# Patient Record
Sex: Male | Born: 1935 | Race: White | Hispanic: No | Marital: Married | Smoking: Never smoker
Health system: Southern US, Community
[De-identification: ages and names within clinical notes are randomized; demographics above are authoritative.]

## PROBLEM LIST (undated history)

## (undated) DIAGNOSIS — M160 Bilateral primary osteoarthritis of hip: Secondary | ICD-10-CM

## (undated) DIAGNOSIS — G629 Polyneuropathy, unspecified: Secondary | ICD-10-CM

## (undated) DIAGNOSIS — L989 Disorder of the skin and subcutaneous tissue, unspecified: Secondary | ICD-10-CM

## (undated) DIAGNOSIS — G510 Bell's palsy: Secondary | ICD-10-CM

## (undated) DIAGNOSIS — K5900 Constipation, unspecified: Secondary | ICD-10-CM

## (undated) DIAGNOSIS — K219 Gastro-esophageal reflux disease without esophagitis: Secondary | ICD-10-CM

## (undated) DIAGNOSIS — M1990 Unspecified osteoarthritis, unspecified site: Secondary | ICD-10-CM

## (undated) DIAGNOSIS — R7301 Impaired fasting glucose: Secondary | ICD-10-CM

## (undated) DIAGNOSIS — H409 Unspecified glaucoma: Secondary | ICD-10-CM

## (undated) DIAGNOSIS — G4733 Obstructive sleep apnea (adult) (pediatric): Secondary | ICD-10-CM

## (undated) DIAGNOSIS — H532 Diplopia: Secondary | ICD-10-CM

## (undated) DIAGNOSIS — D126 Benign neoplasm of colon, unspecified: Secondary | ICD-10-CM

## (undated) DIAGNOSIS — S060XAA Concussion with loss of consciousness status unknown, initial encounter: Secondary | ICD-10-CM

## (undated) DIAGNOSIS — M545 Low back pain, unspecified: Secondary | ICD-10-CM

## (undated) DIAGNOSIS — S060X9A Concussion with loss of consciousness of unspecified duration, initial encounter: Secondary | ICD-10-CM

## (undated) DIAGNOSIS — G379 Demyelinating disease of central nervous system, unspecified: Secondary | ICD-10-CM

## (undated) HISTORY — PX: ANKLE SURGERY: SHX546

## (undated) HISTORY — PX: OTHER SURGICAL HISTORY: SHX169

## (undated) HISTORY — DX: Low back pain: M54.5

## (undated) HISTORY — DX: Unspecified glaucoma: H40.9

## (undated) HISTORY — DX: Bell's palsy: G51.0

## (undated) HISTORY — DX: Obstructive sleep apnea (adult) (pediatric): G47.33

## (undated) HISTORY — DX: Concussion with loss of consciousness status unknown, initial encounter: S06.0XAA

## (undated) HISTORY — DX: Bilateral primary osteoarthritis of hip: M16.0

## (undated) HISTORY — PX: APPENDECTOMY: SHX54

## (undated) HISTORY — PX: IRRIGATION AND DEBRIDEMENT SEBACEOUS CYST: SHX5255

## (undated) HISTORY — DX: Demyelinating disease of central nervous system, unspecified: G37.9

## (undated) HISTORY — DX: Disorder of the skin and subcutaneous tissue, unspecified: L98.9

## (undated) HISTORY — DX: Low back pain, unspecified: M54.50

## (undated) HISTORY — DX: Diplopia: H53.2

## (undated) HISTORY — DX: Polyneuropathy, unspecified: G62.9

## (undated) HISTORY — DX: Benign neoplasm of colon, unspecified: D12.6

## (undated) HISTORY — PX: CARDIAC CATHETERIZATION: SHX172

## (undated) HISTORY — DX: Constipation, unspecified: K59.00

## (undated) HISTORY — DX: Concussion with loss of consciousness of unspecified duration, initial encounter: S06.0X9A

## (undated) HISTORY — DX: Impaired fasting glucose: R73.01

---

## 2000-02-08 ENCOUNTER — Ambulatory Visit (HOSPITAL_COMMUNITY): Admission: RE | Admit: 2000-02-08 | Discharge: 2000-02-08 | Payer: Self-pay | Admitting: Gastroenterology

## 2000-02-11 ENCOUNTER — Ambulatory Visit (HOSPITAL_BASED_OUTPATIENT_CLINIC_OR_DEPARTMENT_OTHER): Admission: RE | Admit: 2000-02-11 | Discharge: 2000-02-11 | Payer: Self-pay | Admitting: General Surgery

## 2002-09-12 ENCOUNTER — Encounter: Payer: Self-pay | Admitting: Gastroenterology

## 2002-09-12 ENCOUNTER — Encounter: Admission: RE | Admit: 2002-09-12 | Discharge: 2002-09-12 | Payer: Self-pay | Admitting: Gastroenterology

## 2005-11-25 ENCOUNTER — Encounter: Admission: RE | Admit: 2005-11-25 | Discharge: 2005-11-25 | Payer: Self-pay | Admitting: Internal Medicine

## 2011-07-19 ENCOUNTER — Other Ambulatory Visit: Payer: Self-pay | Admitting: Dermatology

## 2012-02-10 ENCOUNTER — Encounter (HOSPITAL_COMMUNITY): Payer: Self-pay | Admitting: Pharmacy Technician

## 2012-02-10 ENCOUNTER — Encounter (HOSPITAL_COMMUNITY): Payer: Self-pay | Admitting: *Deleted

## 2012-02-14 ENCOUNTER — Ambulatory Visit (HOSPITAL_COMMUNITY): Payer: Medicare Other | Admitting: Anesthesiology

## 2012-02-14 ENCOUNTER — Encounter (HOSPITAL_COMMUNITY)
Admission: RE | Disposition: A | Discharge status: Home / Self Care | Payer: Self-pay | Source: Ambulatory Visit | Attending: Gastroenterology

## 2012-02-14 ENCOUNTER — Ambulatory Visit (HOSPITAL_COMMUNITY)
Admission: RE | Admit: 2012-02-14 | Discharge: 2012-02-14 | Disposition: A | Discharge status: Home / Self Care | Payer: Medicare Other | Source: Ambulatory Visit | Attending: Gastroenterology | Admitting: Gastroenterology

## 2012-02-14 ENCOUNTER — Encounter (HOSPITAL_COMMUNITY): Payer: Self-pay | Admitting: Anesthesiology

## 2012-02-14 ENCOUNTER — Encounter (HOSPITAL_COMMUNITY): Payer: Self-pay | Admitting: *Deleted

## 2012-02-14 DIAGNOSIS — Z860100 Personal history of colon polyps, unspecified: Secondary | ICD-10-CM | POA: Insufficient documentation

## 2012-02-14 DIAGNOSIS — Z79899 Other long term (current) drug therapy: Secondary | ICD-10-CM | POA: Insufficient documentation

## 2012-02-14 DIAGNOSIS — Z7982 Long term (current) use of aspirin: Secondary | ICD-10-CM | POA: Insufficient documentation

## 2012-02-14 DIAGNOSIS — K5900 Constipation, unspecified: Secondary | ICD-10-CM | POA: Insufficient documentation

## 2012-02-14 DIAGNOSIS — H409 Unspecified glaucoma: Secondary | ICD-10-CM | POA: Insufficient documentation

## 2012-02-14 DIAGNOSIS — K5730 Diverticulosis of large intestine without perforation or abscess without bleeding: Secondary | ICD-10-CM | POA: Insufficient documentation

## 2012-02-14 DIAGNOSIS — Z09 Encounter for follow-up examination after completed treatment for conditions other than malignant neoplasm: Secondary | ICD-10-CM | POA: Insufficient documentation

## 2012-02-14 DIAGNOSIS — K219 Gastro-esophageal reflux disease without esophagitis: Secondary | ICD-10-CM | POA: Insufficient documentation

## 2012-02-14 HISTORY — DX: Gastro-esophageal reflux disease without esophagitis: K21.9

## 2012-02-14 HISTORY — PX: COLONOSCOPY WITH PROPOFOL: SHX5780

## 2012-02-14 HISTORY — DX: Unspecified osteoarthritis, unspecified site: M19.90

## 2012-02-14 SURGERY — COLONOSCOPY WITH PROPOFOL
Anesthesia: Monitor Anesthesia Care

## 2012-02-14 MED ORDER — PROPOFOL 10 MG/ML IV EMUL
INTRAVENOUS | Status: DC | PRN
  Administered 2012-02-14: 120 ug/kg/min via INTRAVENOUS

## 2012-02-14 MED ORDER — MIDAZOLAM HCL 5 MG/5ML IJ SOLN
INTRAMUSCULAR | Status: DC | PRN
  Administered 2012-02-14: 2 mg via INTRAVENOUS

## 2012-02-14 MED ORDER — LACTATED RINGERS IV SOLN
INTRAVENOUS | Status: DC | PRN
  Administered 2012-02-14: 13:00:00 via INTRAVENOUS

## 2012-02-14 MED ORDER — KETAMINE HCL 10 MG/ML IJ SOLN
INTRAMUSCULAR | Status: DC | PRN
  Administered 2012-02-14 (×2): 10 mg via INTRAVENOUS

## 2012-02-14 MED ORDER — FENTANYL CITRATE 0.05 MG/ML IJ SOLN
INTRAMUSCULAR | Status: DC | PRN
  Administered 2012-02-14: 50 ug via INTRAVENOUS

## 2012-02-14 SURGICAL SUPPLY — 22 items

## 2012-02-14 NOTE — Preoperative (Signed)
Beta Blockers   Reason not to administer Beta Blockers:Not Applicable 

## 2012-02-14 NOTE — Anesthesia Preprocedure Evaluation (Addendum)
Anesthesia Evaluation  Patient identified by MRN, date of birth, ID band Patient awake    Reviewed: Allergy & Precautions, H&P , NPO status , Patient's Chart, lab work & pertinent test results  Airway Mallampati: II TM Distance: >3 FB Neck ROM: Full    Dental No notable dental hx.    Pulmonary neg pulmonary ROS,  breath sounds clear to auscultation  Pulmonary exam normal       Cardiovascular negative cardio ROS  Rhythm:Regular Rate:Normal     Neuro/Psych negative neurological ROS  negative psych ROS   GI/Hepatic negative GI ROS, Neg liver ROS, GERD-  Medicated and Controlled,  Endo/Other  negative endocrine ROS  Renal/GU negative Renal ROS  negative genitourinary   Musculoskeletal negative musculoskeletal ROS (+)   Abdominal   Peds negative pediatric ROS (+)  Hematology negative hematology ROS (+)   Anesthesia Other Findings   Reproductive/Obstetrics negative OB ROS                           Anesthesia Physical Anesthesia Plan  ASA: II  Anesthesia Plan: MAC   Post-op Pain Management:    Induction:   Airway Management Planned: Simple Face Mask  Additional Equipment:   Intra-op Plan:   Post-operative Plan:   Informed Consent: I have reviewed the patients History and Physical, chart, labs and discussed the procedure including the risks, benefits and alternatives for the proposed anesthesia with the patient or authorized representative who has indicated his/her understanding and acceptance.   Dental advisory given  Plan Discussed with: CRNA  Anesthesia Plan Comments:         Anesthesia Quick Evaluation

## 2012-02-14 NOTE — Op Note (Signed)
Procedure: Surveillance colonoscopy  Endoscopist: Danise Edge  Premedication: Propofol administered by anesthesia  Procedure: The patient was placed in the left lateral decubitus position. Anal inspection and digital rectal exam were normal. The Pentax pediatric colonoscope was introduced into the rectum and advanced to the cecum. A normal-appearing ileocecal valve and appendiceal orifice were identified. Colonic preparation for the exam today was good.  Rectum. Normal. Retroflexed view of the distal rectum normal.  Sigmoid colon and descending colon. Normal. Left colonic diverticulosis was present.  Splenic flexure. Normal.  Transverse colon. Normal.  Hepatic flexure. Normal.  Ascending colon. Normal.  Cecum and ileocecal valve. Normal.  Assessment: Normal surveillance proctocolonoscopy to the cecum. Left colonic diverticulosis present.  Recommendations: The patient requires no further surveillance colonoscopies.

## 2012-02-14 NOTE — H&P (Signed)
Procedure: Surveillance colonoscopy  History: The patient is a 78 year old male born 1933/03/11 who is scheduled to undergo a surveillance colonoscopy with polypectomy to prevent colon cancer. He has undergone surveillance colonoscopies in the past to remove adenomatous colon polyps.  I discussed with the patient the complications associated with colonoscopy including intestinal bleeding, intestinal perforation, and oversedation.  Chronic medication: Aspirin. Aleve. Travatan eyedrops. Omeprazole. Omega-3 fatty acids. Simvastatin.  Past medical and surgical history: Inguinal herniorrhaphy. Appendectomy. Ankle surgery. Sebaceous skin cyst removal. Glaucoma. Chronic constipation. Gastroesophageal reflux. Chronic low back pain. Impaired fasting glucose. Raynaud's phenomenon. History of adenomatous colon polyps. Ankle fracture.  Exam: The patient is alert and lying comfortably on the endoscopy stretcher. Sclera are nonicteric. Mouth and throat appear normal. Lungs are clear to auscultation. Cardiac exam reveals a regular rhythm. Abdomen is soft, flat, and nontender to palpation in all quadrants.  Plan: Proceed with surveillance colonoscopy as scheduled.

## 2012-02-14 NOTE — Anesthesia Postprocedure Evaluation (Signed)
  Anesthesia Post-op Note  Patient: Mathew Ochoa  Procedure(s) Performed: Procedure(s) (LRB): COLONOSCOPY WITH PROPOFOL (N/A)  Patient Location: PACU  Anesthesia Type: MAC  Level of Consciousness: awake and alert   Airway and Oxygen Therapy: Patient Spontanous Breathing  Post-op Pain: mild  Post-op Assessment: Post-op Vital signs reviewed, Patient's Cardiovascular Status Stable, Respiratory Function Stable, Patent Airway and No signs of Nausea or vomiting  Post-op Vital Signs: stable  Complications: No apparent anesthesia complications

## 2012-02-14 NOTE — Transfer of Care (Signed)
Immediate Anesthesia Transfer of Care Note  Patient: Roda Shutters  Procedure(s) Performed: Procedure(s) (LRB) with comments: COLONOSCOPY WITH PROPOFOL (N/A) - katrina/leone  Patient Location: PACU  Anesthesia Type:MAC  Level of Consciousness: awake, alert , oriented and patient cooperative  Airway & Oxygen Therapy: Patient Spontanous Breathing and Patient connected to face mask oxygen  Post-op Assessment: Report given to PACU RN, Post -op Vital signs reviewed and stable and Patient moving all extremities X 4  Post vital signs: Reviewed and stable  Complications: No apparent anesthesia complications

## 2012-02-15 ENCOUNTER — Encounter (HOSPITAL_COMMUNITY): Payer: Self-pay | Admitting: Gastroenterology

## 2012-06-12 ENCOUNTER — Ambulatory Visit (INDEPENDENT_AMBULATORY_CARE_PROVIDER_SITE_OTHER): Payer: Medicare Other

## 2012-06-12 DIAGNOSIS — R209 Unspecified disturbances of skin sensation: Secondary | ICD-10-CM

## 2012-06-13 NOTE — Progress Notes (Signed)
  Mathew Ochoa is a 79 year old gentleman with a history of diabetes. The patient gives a history in the distant past with exposure to agent orange. The patient is being evaluated for a possible peripheral neuropathy.  Nerve conduction studies were performed on all 4 extremities. The distal motor latencies for the median nerves were prolonged bilaterally, with normal motor amplitudes seen for these nerves. The distal motor latencies for the ulnar nerves were prolonged on the left, normal on the right, with normal motor amplitudes for these nerves. The F wave latencies for the median nerves were prolonged on the right, normal on the left, and normal for the ulnar nerves bilaterally. The nerve conduction velocities for the median and ulnar nerves were normal bilaterally. The sensory latencies for the median nerves were prolonged bilaterally, normal for the ulnar nerves bilaterally.  The distal motor latencies for the peroneal nerves were prolonged on the left, normal on the right, with normal motor amplitudes for these nerves bilaterally. The nerve conduction velocities for the peroneal nerves were normal bilaterally. The distal motor latencies for the posterior tibial nerves were prolonged bilaterally, with low normal motor amplitudes for these nerves bilaterally. The nerve conduction velocities for the posterior tibial nerves were normal. The sensory latencies for the peroneal nerves were prolonged on the right, and normal on the left.  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 No other significant abnormalities were seen.

## 2012-06-15 NOTE — Addendum Note (Signed)
Addended by: Gearldine Shown on: 06/15/2012 02:51 PM   Modules accepted: Orders

## 2012-07-12 ENCOUNTER — Institutional Professional Consult (permissible substitution): Payer: Medicare Other | Admitting: Diagnostic Neuroimaging

## 2012-07-19 ENCOUNTER — Ambulatory Visit (INDEPENDENT_AMBULATORY_CARE_PROVIDER_SITE_OTHER): Payer: Medicare Other | Admitting: Diagnostic Neuroimaging

## 2012-07-19 ENCOUNTER — Encounter: Payer: Self-pay | Admitting: Diagnostic Neuroimaging

## 2012-07-19 VITALS — BP 127/72 | HR 59 | Temp 98.0°F | Ht 68.5 in | Wt 204.5 lb

## 2012-07-19 DIAGNOSIS — G589 Mononeuropathy, unspecified: Secondary | ICD-10-CM

## 2012-07-19 DIAGNOSIS — G629 Polyneuropathy, unspecified: Secondary | ICD-10-CM | POA: Insufficient documentation

## 2012-07-19 NOTE — Progress Notes (Signed)
[facility]  PATIENT: Mathew Ochoa DOB: February 23, 1933  REFERRING CLINICIAN: Osbourne HISTORY FROM: patient and wife REASON FOR VISIT: new consult   HISTORICAL  CHIEF COMPLAINT:  Chief Complaint  Patient presents with  . Follow-up    NCV    HISTORY OF PRESENT ILLNESS:   79 year old male here for evaluation of neuropathy. Patient reports a least ten-year history of intermittent numbness and tingling in his fingers and feet. He also notices color change of his fingertips and toes in cold weather (changes to blue-collar). Fingers and toes do not turn white. Patient did not seek medical attention for this problem until recently. Patient had nerve conduction study which showed evidence for bilateral carpal tunnel syndrome as well as underlying mixed axonal and demyelinating peripheral neuropathy. Patient's been evaluated by rheumatology for Raynaud's syndrome. Extensive serum testing has been unremarkable so far.  Patient reports that his symptoms are significantly painful when he is in cold situations. Temperature is warm his symptoms are not as severe. During our conversation patient reports mild discomfort. However he is not interested in taking additional medication for pain control. He is also not that interested in pursuing additional diagnostic testing such as lumbar puncture for evaluation of possible CIDP, nor treatment with IVIG at this time. He is pursuing disability services through the [employer]. Patient reports possible exposure to agent orange during his service.  Patient's wife is accompanying him for this visit. During our conversation she repeatedly asked me whether patient's peripheral neuropathy, Raynaud's phenomenon symptoms, or anything else could explain why the patient is having significant memory problems. Patient acknowledges some memory problems. However referring notes do not mention this problem. I spent the majority of this visit  evaluating the patient's neuropathy, and therefore did not have opportunity to fully evaluate his memory issue.    REVIEW OF SYSTEMS: Full 14 system review of systems performed and notable only for memory loss.  ALLERGIES: No Known Allergies  HOME MEDICATIONS: Outpatient Prescriptions Prior to Visit  Medication Sig Dispense Refill  . aspirin EC 81 MG tablet Take 81 mg by mouth every morning.      Marland Kitchen ibuprofen (ADVIL,MOTRIN) 200 MG tablet Take 200 mg by mouth every 6 (six) hours as needed. Pain      . naproxen sodium (ANAPROX) 220 MG tablet Take 220 mg by mouth 2 (two) times daily with a meal.      . omeprazole (PRILOSEC) 20 MG capsule Take 20 mg by mouth daily.      . simvastatin (ZOCOR) 20 MG tablet Take 20 mg by mouth every evening.      . timolol (TIMOPTIC) 0.5 % ophthalmic solution Place 1 drop into both eyes 2 (two) times daily.      . Travoprost, BAK Free, (TRAVATAN) 0.004 % SOLN ophthalmic solution Place 1 drop into both eyes at bedtime.      . fish oil-omega-3 fatty acids 1000 MG capsule Take 2 g by mouth daily.       No facility-administered medications prior to visit.    PAST MEDICAL HISTORY: Past Medical History  Diagnosis Date  . GERD (gastroesophageal reflux disease)   . Arthritis     PAST SURGICAL HISTORY: Past Surgical History  Procedure Laterality Date  . Right leg surgery for fracture  yrs ago    pin later removed  . Appendectomy      79 yrs old  . Colonoscopy with propofol  02/14/2012    Procedure: COLONOSCOPY WITH PROPOFOL;  Surgeon: Charolett Bumpers, MD;  Location: [facility];  Service: Endoscopy;  Laterality: N/A;  katrina/leone    FAMILY HISTORY: Family History  Problem Relation Age of Onset  . Heart failure Mother   . Heart Problems Father   . Diabetes Father     SOCIAL HISTORY:  History   Social History  . Marital Status: Married    Spouse Name: Darl Pikes    Number of Children: 3  . Years of Education: college   Occupational History  .  Driver     Part Time @ [employer]   Social History Main Topics  . Smoking status: Never Smoker   . Smokeless tobacco: Never Used  . Alcohol Use: No  . Drug Use: No  . Sexually Active:    Other Topics Concern  . Not on file   Social History Narrative   Pt lives at home with his spouse.   Caffeine Use- Rarely drinks 1 soda.     PHYSICAL EXAM  Filed Vitals:   07/19/12 1542  BP: 127/72  Pulse: 59  Temp: 98 F (36.7 C)  TempSrc: Oral  Height: 5' 8.5" (1.74 m)  Weight: 204 lb 8 oz (92.761 kg)   Body mass index is 30.64 kg/(m^2).  GENERAL EXAM: Patient is in no distress  CARDIOVASCULAR: Regular rate and rhythm, no murmurs, no carotid bruits  NEUROLOGIC: MENTAL STATUS: awake, alert, language fluent, comprehension intact, naming intact CRANIAL NERVE: no papilledema on fundoscopic exam, pupils equal and reactive to light, visual fields full to confrontation, extraocular muscles intact, no nystagmus, facial sensation and strength symmetric, uvula midline, shoulder shrug symmetric, tongue midline. MOTOR: normal bulk and tone, full strength in the BUE, BLE SENSORY: DECREASED PP, LT IN TOES IN GRADIENT UP TO SHINS. NORMAL PROPRIO. VIB < 6 SEC AT TOES. COORDINATION: finger-nose-finger, fine finger movements normal REFLEXES: BUE 1, KNEES TRACE, ANKLES 0. GAIT/STATION: narrow based gait; DIFF WITH TOE, HEEL AND TANDEM. Romberg is negative   DIAGNOSTIC DATA (LABS, IMAGING, TESTING) - I reviewed patient records, labs, notes, testing and imaging myself where available.  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  ASSESSMENT AND PLAN  79 y.o. year old male  has a past medical history of GERD (gastroesophageal reflux disease) and Arthritis. here with numbness and tingling in his hands and feet. History, exam, nerve conduction study consistent with underlying distal symmetric peripheral neuropathy and bilateral carpal tunnel syndrome. No clear etiology. Symptoms are intermittently painful. I discussed empiric trial of gabapentin, Lyrica or Cymbalta or neuropathy cream, which could minimize side effects for pain control. Patient is reluctant to try this at this time.  Also, nerve conduction study raises possibility of underlying demyelinating neuropathy, which could further  be evaluated with lumbar puncture and additional blood testing. In turn this could lead to potential treatment with IVIG or other immune modulatory agents.patient is not that interested in pursuing this at this time. He is more interested in pursuing [facility] disability [facility] time.   Of note patient's wife is more interested in pursuing further testing and treatment of memory loss. Unfortunately I did not have the opportunity to fully evaluate this during limited consultation time, which was requested for neuropathy and therefore what I focused on. Patient reports a followup visit with his PCP and the [facility] physician in the coming 1-2 months. I recommended that he and his wife bring up these issues with them for further evaluation.   Suanne Marker, MD 07/19/2012, 4:48 PM Certified in  Neurology, Neurophysiology and Neuroimaging  [facility] [address], [address] [place], [place] [address] [phone]

## 2012-07-19 NOTE — Patient Instructions (Signed)
Use body warming techniques.   Consider gabapentin.   If symptoms are severe enough, we can consider further testing for other causes of neuropathy (such as lumbar puncture to test for CIDP).

## 2013-07-22 ENCOUNTER — Telehealth: Payer: Self-pay | Admitting: Diagnostic Neuroimaging

## 2013-07-22 ENCOUNTER — Encounter (INDEPENDENT_AMBULATORY_CARE_PROVIDER_SITE_OTHER): Payer: Self-pay

## 2013-07-22 ENCOUNTER — Encounter: Payer: Self-pay | Admitting: Diagnostic Neuroimaging

## 2013-07-22 ENCOUNTER — Ambulatory Visit (INDEPENDENT_AMBULATORY_CARE_PROVIDER_SITE_OTHER): Payer: Medicare Other | Admitting: Diagnostic Neuroimaging

## 2013-07-22 VITALS — BP 152/80 | HR 60 | Ht 68.0 in | Wt 212.0 lb

## 2013-07-22 DIAGNOSIS — F329 Major depressive disorder, single episode, unspecified: Secondary | ICD-10-CM

## 2013-07-22 DIAGNOSIS — G4733 Obstructive sleep apnea (adult) (pediatric): Secondary | ICD-10-CM

## 2013-07-22 DIAGNOSIS — Z630 Problems in relationship with spouse or partner: Secondary | ICD-10-CM

## 2013-07-22 DIAGNOSIS — F3289 Other specified depressive episodes: Secondary | ICD-10-CM

## 2013-07-22 DIAGNOSIS — F32A Depression, unspecified: Secondary | ICD-10-CM

## 2013-07-22 DIAGNOSIS — R413 Other amnesia: Secondary | ICD-10-CM

## 2013-07-22 NOTE — Progress Notes (Signed)
[facility]  PATIENT: Mathew Ochoa DOB: 1932/06/11  REFERRING CLINICIAN: Osbourne HISTORY FROM: patient and wife REASON FOR VISIT: new consult   HISTORICAL  CHIEF COMPLAINT:  Chief Complaint  Patient presents with  . Memory Loss    NCXP #6    HISTORY OF PRESENT ILLNESS:   80 year old male here for evaluation of memory problems.  Patient is in exam room alone, and I examined evaluation first and then brought his wife in for interview. Patient himself has noted some problems with forgetting peoples names, occasionally getting lost with driving directions. He says that he can pay bills, drive, take care of yard work and activities of daily living without difficulty. He still works 1 day week. Patient acknowledges poor sleep for many years. He sleeps at 8 PM and wakes up at 4 AM. However he tosses and turns all night. He and his wife slept in different rooms for many years due to his severe snoring problem. Patient sleep study in the past, diagnosed with mild sleep apnea and recommended to have CPAP she. However patient could not tolerate CPAP mask and therefore never tried using it.  Patient having more energy problems, agitation, confusion short temper. He and his wife have had some marital problems for many years, including seeing marriage counselor 20 years ago. When patient's wife came to the room she also mentioned that he is more "mean-spirited" with her than previously. She has noted that he is forgetting peoples names including family members, coworkers. Apparently patient's daughters and patient's coworkers have noticed some personality, behavior and memory problems. When they were in [facility] recently he was getting lost with driving directions.  REVIEW OF SYSTEMS: Full 14 system review of systems performed and notable only for agitation behavior problem confusion neck pain neck stiffness frequent waking cold intolerance memory loss runny nose  fatigue.  ALLERGIES: No Known Allergies  HOME MEDICATIONS: Outpatient Prescriptions Prior to Visit  Medication Sig Dispense Refill  . aspirin EC 81 MG tablet Take 81 mg by mouth every morning.      Marland Kitchen ibuprofen (ADVIL,MOTRIN) 200 MG tablet Take 200 mg by mouth every 6 (six) hours as needed. Pain      . lisinopril-hydrochlorothiazide (PRINZIDE,ZESTORETIC) 10-12.5 MG per tablet Take 1 tablet by mouth daily.      . naproxen sodium (ANAPROX) 220 MG tablet Take 220 mg by mouth 2 (two) times daily with a meal.      . omeprazole (PRILOSEC) 20 MG capsule Take 20 mg by mouth daily.      . timolol (TIMOPTIC) 0.5 % ophthalmic solution Place 1 drop into both eyes 2 (two) times daily.      . Travoprost, BAK Free, (TRAVATAN) 0.004 % SOLN ophthalmic solution Place 1 drop into both eyes at bedtime.      . fish oil-omega-3 fatty acids 1000 MG capsule Take 2 g by mouth daily.      . simvastatin (ZOCOR) 20 MG tablet Take 20 mg by mouth every evening.       No facility-administered medications prior to visit.    PAST MEDICAL HISTORY: Past Medical History  Diagnosis Date  . GERD (gastroesophageal reflux disease)   . Arthritis     PAST SURGICAL HISTORY: Past Surgical History  Procedure Laterality Date  . Right leg surgery for fracture  yrs ago    pin later removed  . Appendectomy      80 yrs old  . Colonoscopy with propofol  02/14/2012  Procedure: COLONOSCOPY WITH PROPOFOL;  Surgeon: Garlan Fair, MD;  Location: [facility];  Service: Endoscopy;  Laterality: N/A;  katrina/leone    FAMILY HISTORY: Family History  Problem Relation Age of Onset  . Heart failure Mother   . Heart Problems Father   . Diabetes Father     SOCIAL HISTORY:  History   Social History  . Marital Status: Married    Spouse Name: Manuela Schwartz    Number of Children: 3  . Years of Education: college   Occupational History  . Driver     Part Time @ [employer]   Social History Main Topics  . Smoking  status: Never Smoker   . Smokeless tobacco: Never Used  . Alcohol Use: No  . Drug Use: No  . Sexual Activity: Not on file   Other Topics Concern  . Not on file   Social History Narrative   Pt lives at home with his spouse.   Caffeine Use- Rarely drinks 1 soda.     PHYSICAL EXAM  Filed Vitals:   07/22/13 1029  BP: 152/80  Pulse: 60  Height: 5\' 8"  (1.727 m)  Weight: 212 lb (96.163 kg)    Not recorded    Body mass index is 32.24 kg/(m^2).  GENERAL EXAM: Patient is in no distress; well developed, nourished and groomed; neck is supple  CARDIOVASCULAR: Regular rate and rhythm, no murmurs, no carotid bruits  NEUROLOGIC: MENTAL STATUS: awake, alert, oriented to person, place and time, recent and remote memory intact, normal attention and concentration, language fluent, comprehension intact, naming intact, fund of knowledge appropriate; MMSE 25/30. MISSES 1 ON REGISTRATION, 2 ON RECALL, 1 ON REPETITION AND 1 ON PENTAGONS. CLOCK DRAWING NORMAL. AFT 13. GDS 5. BORDERLINE SNOUT AND MYERSONS SIGNS. NEG PALMOMENTAL.  CRANIAL NERVE: no papilledema on fundoscopic exam, pupils equal and reactive to light, visual fields full to confrontation, extraocular muscles intact, no nystagmus, facial sensation and strength symmetric, hearing intact, palate elevates symmetrically, uvula midline, shoulder shrug symmetric, tongue midline. MOTOR: normal bulk and tone, full strength in the BUE, BLE SENSORY: normal and symmetric to light touch, pinprick, temperature, vibration COORDINATION: finger-nose-finger, fine finger movements, heel-shin normal REFLEXES: deep tendon reflexes present and symmetric GAIT/STATION: narrow based gait; able to walk on toes, heels and tandem; romberg is negative    DIAGNOSTIC DATA (LABS, IMAGING, TESTING) - I reviewed patient records, labs, notes, testing and imaging myself where available.  No results found for this basename: WBC,  HGB,  HCT,  MCV,  PLT   No results  found for this basename: na,  k,  cl,  co2,  glucose,  bun,  creatinine,  calcium,  prot,  albumin,  ast,  alt,  alkphos,  bilitot,  gfrnonaa,  gfraa   No results found for this basename: CHOL,  HDL,  LDLCALC,  LDLDIRECT,  TRIG,  CHOLHDL   No results found for this basename: HGBA1C   No results found for this basename: VITAMINB12   No results found for this basename: TSH      ASSESSMENT AND PLAN  80 y.o. year old male here with memory loss, navigation problems, behavior/personality changes since past 3-4 years. Financial and marital stress are contributing factors. Also with untreated sleep apnea.   Ddx: mild cognitive impairment, mild dementia, pseudodementia of depression, sleep apnea, marital discord  PLAN: - neuropsych testing - MRI brain  - encouraged sleep apnea eval and treatment; he will consider and then discuss with Dr. Isidoro Donning - marriage  counselling - follow up in 3-4 months; may consider donepezil at that time   Orders Placed This Encounter  Procedures  . MR Brain Wo Contrast  . Ambulatory referral to Neuropsychology   Return in about 4 months (around 11/21/2013).    Penni Bombard, MD 9/93/7169, 67:89 AM Certified in Neurology, Neurophysiology and Neuroimaging  [facility] [address] [place], [place] [address] [phone]

## 2013-08-05 ENCOUNTER — Ambulatory Visit
Admission: RE | Admit: 2013-08-05 | Discharge: 2013-08-05 | Disposition: A | Discharge status: Home / Self Care | Payer: Medicare Other | Source: Ambulatory Visit | Attending: Diagnostic Neuroimaging | Admitting: Diagnostic Neuroimaging

## 2013-08-05 DIAGNOSIS — F32A Depression, unspecified: Secondary | ICD-10-CM

## 2013-08-05 DIAGNOSIS — R413 Other amnesia: Secondary | ICD-10-CM

## 2013-08-05 DIAGNOSIS — F329 Major depressive disorder, single episode, unspecified: Secondary | ICD-10-CM

## 2013-09-05 ENCOUNTER — Telehealth: Payer: Self-pay | Admitting: *Deleted

## 2013-09-05 NOTE — Telephone Encounter (Signed)
I called and spoke to pt and relayed the MRI brain results. He did not want to do the neuropsych eval. Has f/u appt 10-21-13 at 0830.

## 2013-10-21 ENCOUNTER — Ambulatory Visit: Payer: Medicare Other | Admitting: Diagnostic Neuroimaging

## 2015-04-09 DIAGNOSIS — H401131 Primary open-angle glaucoma, bilateral, mild stage: Secondary | ICD-10-CM | POA: Diagnosis not present

## 2015-09-24 DIAGNOSIS — H401131 Primary open-angle glaucoma, bilateral, mild stage: Secondary | ICD-10-CM | POA: Diagnosis not present

## 2015-10-06 DIAGNOSIS — G608 Other hereditary and idiopathic neuropathies: Secondary | ICD-10-CM | POA: Diagnosis not present

## 2015-10-06 DIAGNOSIS — R0789 Other chest pain: Secondary | ICD-10-CM | POA: Diagnosis not present

## 2016-04-06 DIAGNOSIS — H401132 Primary open-angle glaucoma, bilateral, moderate stage: Secondary | ICD-10-CM | POA: Diagnosis not present

## 2016-04-12 DIAGNOSIS — Z1389 Encounter for screening for other disorder: Secondary | ICD-10-CM | POA: Diagnosis not present

## 2016-04-12 DIAGNOSIS — G3184 Mild cognitive impairment, so stated: Secondary | ICD-10-CM | POA: Diagnosis not present

## 2016-04-12 DIAGNOSIS — Z7189 Other specified counseling: Secondary | ICD-10-CM | POA: Diagnosis not present

## 2016-04-12 DIAGNOSIS — G608 Other hereditary and idiopathic neuropathies: Secondary | ICD-10-CM | POA: Diagnosis not present

## 2016-04-12 DIAGNOSIS — K219 Gastro-esophageal reflux disease without esophagitis: Secondary | ICD-10-CM | POA: Diagnosis not present

## 2016-04-12 DIAGNOSIS — Z0000 Encounter for general adult medical examination without abnormal findings: Secondary | ICD-10-CM | POA: Diagnosis not present

## 2016-04-12 DIAGNOSIS — N183 Chronic kidney disease, stage 3 (moderate): Secondary | ICD-10-CM | POA: Diagnosis not present

## 2016-04-12 DIAGNOSIS — E7800 Pure hypercholesterolemia, unspecified: Secondary | ICD-10-CM | POA: Diagnosis not present

## 2016-04-20 DIAGNOSIS — H401132 Primary open-angle glaucoma, bilateral, moderate stage: Secondary | ICD-10-CM | POA: Diagnosis not present

## 2016-06-02 DIAGNOSIS — H401132 Primary open-angle glaucoma, bilateral, moderate stage: Secondary | ICD-10-CM | POA: Diagnosis not present

## 2016-06-30 DIAGNOSIS — H401132 Primary open-angle glaucoma, bilateral, moderate stage: Secondary | ICD-10-CM | POA: Diagnosis not present

## 2016-09-01 DIAGNOSIS — H401132 Primary open-angle glaucoma, bilateral, moderate stage: Secondary | ICD-10-CM | POA: Diagnosis not present

## 2016-09-15 DIAGNOSIS — H401132 Primary open-angle glaucoma, bilateral, moderate stage: Secondary | ICD-10-CM | POA: Diagnosis not present

## 2016-12-29 DIAGNOSIS — H401132 Primary open-angle glaucoma, bilateral, moderate stage: Secondary | ICD-10-CM | POA: Diagnosis not present

## 2017-01-26 DIAGNOSIS — H401132 Primary open-angle glaucoma, bilateral, moderate stage: Secondary | ICD-10-CM | POA: Diagnosis not present

## 2017-01-26 DIAGNOSIS — H524 Presbyopia: Secondary | ICD-10-CM | POA: Diagnosis not present

## 2017-02-10 DIAGNOSIS — H401132 Primary open-angle glaucoma, bilateral, moderate stage: Secondary | ICD-10-CM | POA: Diagnosis not present

## 2017-02-13 DIAGNOSIS — H401132 Primary open-angle glaucoma, bilateral, moderate stage: Secondary | ICD-10-CM | POA: Diagnosis not present

## 2017-02-15 DIAGNOSIS — H401132 Primary open-angle glaucoma, bilateral, moderate stage: Secondary | ICD-10-CM | POA: Insufficient documentation

## 2017-02-23 ENCOUNTER — Encounter (HOSPITAL_COMMUNITY): Payer: Self-pay

## 2017-02-23 ENCOUNTER — Emergency Department (HOSPITAL_COMMUNITY)
Admission: EM | Admit: 2017-02-23 | Discharge: 2017-02-23 | Disposition: A | Discharge status: Home / Self Care | Payer: Medicare HMO | Attending: Emergency Medicine | Admitting: Emergency Medicine

## 2017-02-23 ENCOUNTER — Emergency Department (HOSPITAL_COMMUNITY): Payer: Medicare HMO

## 2017-02-23 ENCOUNTER — Other Ambulatory Visit: Payer: Self-pay

## 2017-02-23 DIAGNOSIS — Z7982 Long term (current) use of aspirin: Secondary | ICD-10-CM | POA: Insufficient documentation

## 2017-02-23 DIAGNOSIS — K5900 Constipation, unspecified: Secondary | ICD-10-CM

## 2017-02-23 DIAGNOSIS — Z79899 Other long term (current) drug therapy: Secondary | ICD-10-CM | POA: Diagnosis not present

## 2017-02-23 MED ORDER — FLEET ENEMA 7-19 GM/118ML RE ENEM
1.0000 | ENEMA | Freq: Once | RECTAL | Status: AC
  Administered 2017-02-23: 1 via RECTAL
  Filled 2017-02-23: qty 1

## 2017-02-23 NOTE — ED Triage Notes (Signed)
BIB EMS from home for constipation. LBM 11/26. Has tried colace with no relief. Called PCP and was unable to get an appointment for today until 4 pm today and called EMS. A/O x4, ambulatory.

## 2017-02-23 NOTE — ED Notes (Signed)
Bed: GY18 Expected date:  Expected time:  Means of arrival:  Comments: EMS-constipation-

## 2017-02-23 NOTE — Discharge Instructions (Signed)
Please read the attached information regarding her condition. Continue your home medications as previously prescribed. Follow-up with your primary care provider for further evaluation. Return to ED for severe abdominal pain, changes in bowel habits, urinary symptoms, blood in stool.

## 2017-02-23 NOTE — ED Provider Notes (Signed)
[facility] DEPT Provider Note   CSN: [id number] Arrival date & time: 02/23/17  1451     History   Chief Complaint Chief Complaint  Patient presents with  . Constipation    HPI Mathew Ochoa is a 83 y.o. male with past medical history of constipation, presents to ED for constipation.  He states that his last bowel movement was Monday (today is Thursday).  He has tried taking Dulcolax with mild relief in his symptoms.  He states that he has 2 small bowel movements today but still feels like he is constipated.  He does report associated abdominal discomfort.  He admits to only drinking 3-4 cups of water daily.  He denies any new medication changes, urinary symptoms, nausea, vomiting, prior abdominal surgeries, fevers, falls or injuries.  HPI  Past Medical History:  Diagnosis Date  . Adenomatous colon polyp   . Arthritis   . Concussion   . Constipation   . 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 Diplopia   . Facial palsy   . GERD (gastroesophageal reflux disease)   . Glaucoma   . Impaired fasting glucose   . Lower back pain   . OSA (obstructive sleep apnea)   . Osteoarthritis of both hips   . Peripheral neuropathy   . Skin lesions     Patient Active Problem List   Diagnosis Date Noted  . Neuropathy 07/19/2012    Past Surgical History:  Procedure Laterality Date  . ANKLE SURGERY    . APPENDECTOMY     83 yrs old  . APPENDECTOMY    . COLONOSCOPY WITH PROPOFOL  02/14/2012   Procedure: COLONOSCOPY WITH PROPOFOL;  Surgeon: Garlan Fair, MD;  Location: [facility];  Service: Endoscopy;  Laterality: N/A;  katrina/leone  . inguinal heniorrhaphy    . IRRIGATION AND DEBRIDEMENT SEBACEOUS CYST    . right leg surgery for fracture  yrs ago   pin later removed       Home Medications    Prior to Admission medications   Medication Sig Start Date End Date Taking? Authorizing Provider  acetaZOLAMIDE (DIAMOX) 500 MG capsule  Take 500 mg by mouth every 12 (twelve) hours.  02/10/17  Yes [provider]  aspirin EC 81 MG tablet Take 81 mg by mouth every morning.   Yes [provider]  bisacodyl (BISACODYL) 5 MG EC tablet Take 5 mg by mouth daily as needed for moderate constipation.   Yes [provider]  Ibuprofen-Diphenhydramine HCl (ADVIL PM) 200-25 MG CAPS Take 1 tablet by mouth at bedtime.   Yes [provider]  omeprazole (PRILOSEC) 20 MG capsule Take 20 mg by mouth daily as needed (stomach pain).    Yes [provider]  simvastatin (ZOCOR) 20 MG tablet Take 10 mg by mouth at bedtime.   Yes [provider]  brimonidine (ALPHAGAN P) 0.1 % SOLN     [provider]  Cholecalciferol (VITAMIN D3) 1000 UNITS CAPS Take 1,000 Units by mouth daily.    [provider]  lisinopril-hydrochlorothiazide (PRINZIDE,ZESTORETIC) 10-12.5 MG per tablet Take 1 tablet by mouth daily.    [provider]  naproxen sodium (ANAPROX) 220 MG tablet Take 220 mg by mouth 2 (two) times daily with a meal.    [provider]  simvastatin (ZOCOR) 10 MG tablet Take 10 mg by mouth daily.    [provider]  timolol (TIMOPTIC) 0.5 % ophthalmic solution Place 1 drop  into both eyes 2 (two) times daily.    [provider]  Travoprost, BAK Free, (TRAVATAN) 0.004 % SOLN ophthalmic solution Place 1 drop into both eyes at bedtime.    [provider]    Family History Family History  Problem Relation Age of Onset  . Heart failure Mother   . Diabetes Father   . Heart disease Father   . Heart disease Brother   . Heart disease Brother   . Cancer - Other Daughter   . Cancer - Colon Brother   . Cancer - Prostate Brother     Social History Social History   Tobacco Use  . Smoking status: Never Smoker  . Smokeless tobacco: Never Used  Substance Use Topics  . Alcohol use: No  . Drug use: No     Allergies   Patient has no known  allergies.   Review of Systems Review of Systems  Constitutional: Negative for appetite change, chills and fever.  HENT: Negative for ear pain, rhinorrhea, sneezing and sore throat.   Eyes: Negative for photophobia and visual disturbance.  Respiratory: Negative for cough, chest tightness, shortness of breath and wheezing.   Cardiovascular: Negative for chest pain and palpitations.  Gastrointestinal: Positive for abdominal distention and constipation. Negative for abdominal pain, blood in stool, diarrhea, nausea and vomiting.  Genitourinary: Negative for dysuria, hematuria and urgency.  Musculoskeletal: Negative for myalgias.  Skin: Negative for rash.  Neurological: Negative for dizziness, weakness and light-headedness.     Physical Exam Updated Vital Signs BP (!) 195/166 (BP Location: Left Arm)   Pulse 62   Temp (!) 97.5 F (36.4 C) (Oral)   Resp 18   SpO2 100%   Physical Exam  Constitutional: He appears well-developed and well-nourished. No distress.  HENT:  Head: Normocephalic and atraumatic.  Nose: Nose normal.  Eyes: Conjunctivae and EOM are normal. Right eye exhibits no discharge. Left eye exhibits no discharge. No scleral icterus.  Neck: Normal range of motion. Neck supple.  Cardiovascular: Normal rate, regular rhythm, normal heart sounds and intact distal pulses. Exam reveals no gallop and no friction rub.  No murmur heard. Pulmonary/Chest: Effort normal and breath sounds normal. No respiratory distress.  Abdominal: Soft. Bowel sounds are normal. He exhibits distension. There is no tenderness. There is no guarding.  Musculoskeletal: Normal range of motion. He exhibits no edema.  Neurological: He is alert. He exhibits normal muscle tone. Coordination normal.  Skin: Skin is warm and dry. No rash noted.  Psychiatric: He has a normal mood and affect.  Nursing note and vitals reviewed.    ED Treatments / Results  Labs (all labs ordered are listed, but only abnormal  results are displayed) Labs Reviewed - No data to display  EKG  EKG Interpretation None       Radiology Dg Abdomen 1 View  Result Date: 02/23/2017 CLINICAL DATA:  Bloating and constipation. Last bowel movement was Monday. EXAM: ABDOMEN - 1 VIEW COMPARISON:  09/12/2002 FINDINGS: Moderate stool volume with stool intermittently seen mainly in the ascending, distal transverse, and rectal segments. No colonic over distension. No evidence of small bowel obstruction. No rectal over distention to suggest impaction. Suspect sacrotuberous ligament ossification on the left. There is confluent vertebral body osteophytes. IMPRESSION: Moderate stool volume without obstruction or rectal impaction. Electronically Signed   By: Monte Fantasia M.D.   On: 02/23/2017 16:57    Procedures Procedures (including critical care time)  Medications Ordered in ED Medications  sodium phosphate (FLEET) 7-19  GM/118ML enema 1 enema (1 enema Rectal Given 02/23/17 1914)     Initial Impression / Assessment and Plan / ED Course  I have reviewed the triage vital signs and the nursing notes.  Pertinent labs & imaging results that were available during my care of the patient were reviewed by me and considered in my medical decision making (see chart for details).     Patient presents to ED for evaluation of constipation.  He states that his last bowel movement was on Monday (today is Thursday).  He does have a history of constipation.  He reports no relief with his Dulcolax which he usually takes at home.  On physical exam patient is nontoxic-appearing and in no acute distress.  He does have mild abdominal distention noted but no tenderness to palpation.  He denies any other symptoms at this time.  Abdominal x-ray showed moderate stool burden with no obstruction or impaction.  Patient given enema here with success in bowel movement.  He states that he feels much better and is ready to go home. I have low suspicion for  acute intraabdominal process or surgical emergency at this time. His BP has improved here.  Advised patient to follow-up with his primary care provider and to continue his Dulcolax as needed. Patient appears stable for discharge at this time. Strict return precautions given.  Final Clinical Impressions(s) / ED Diagnoses   Final diagnoses:  Constipation, unspecified constipation type    ED Discharge Orders    None       Delia Heady, PA-C 02/23/17 1918    Lacretia Leigh, MD 02/27/17 [phone]

## 2017-02-28 DIAGNOSIS — R69 Illness, unspecified: Secondary | ICD-10-CM | POA: Diagnosis not present

## 2017-03-01 DIAGNOSIS — R69 Illness, unspecified: Secondary | ICD-10-CM | POA: Diagnosis not present

## 2017-03-02 DIAGNOSIS — H401132 Primary open-angle glaucoma, bilateral, moderate stage: Secondary | ICD-10-CM | POA: Diagnosis not present

## 2017-03-02 DIAGNOSIS — Z961 Presence of intraocular lens: Secondary | ICD-10-CM | POA: Diagnosis not present

## 2017-03-10 DIAGNOSIS — Z79899 Other long term (current) drug therapy: Secondary | ICD-10-CM | POA: Diagnosis not present

## 2017-03-10 DIAGNOSIS — H401133 Primary open-angle glaucoma, bilateral, severe stage: Secondary | ICD-10-CM | POA: Diagnosis not present

## 2017-03-16 DIAGNOSIS — E7800 Pure hypercholesterolemia, unspecified: Secondary | ICD-10-CM | POA: Diagnosis not present

## 2017-03-16 DIAGNOSIS — Z9842 Cataract extraction status, left eye: Secondary | ICD-10-CM | POA: Diagnosis not present

## 2017-03-16 DIAGNOSIS — H401133 Primary open-angle glaucoma, bilateral, severe stage: Secondary | ICD-10-CM | POA: Diagnosis not present

## 2017-03-16 DIAGNOSIS — Z9841 Cataract extraction status, right eye: Secondary | ICD-10-CM | POA: Diagnosis not present

## 2017-03-16 DIAGNOSIS — Z961 Presence of intraocular lens: Secondary | ICD-10-CM | POA: Diagnosis not present

## 2017-03-16 DIAGNOSIS — Z48810 Encounter for surgical aftercare following surgery on the sense organs: Secondary | ICD-10-CM | POA: Diagnosis not present

## 2017-03-17 DIAGNOSIS — R413 Other amnesia: Secondary | ICD-10-CM | POA: Diagnosis not present

## 2017-04-06 DIAGNOSIS — H401132 Primary open-angle glaucoma, bilateral, moderate stage: Secondary | ICD-10-CM | POA: Diagnosis not present

## 2017-04-13 DIAGNOSIS — H401132 Primary open-angle glaucoma, bilateral, moderate stage: Secondary | ICD-10-CM | POA: Diagnosis not present

## 2017-05-11 DIAGNOSIS — H401132 Primary open-angle glaucoma, bilateral, moderate stage: Secondary | ICD-10-CM | POA: Diagnosis not present

## 2017-05-31 DIAGNOSIS — G3184 Mild cognitive impairment, so stated: Secondary | ICD-10-CM | POA: Diagnosis not present

## 2017-05-31 DIAGNOSIS — E7800 Pure hypercholesterolemia, unspecified: Secondary | ICD-10-CM | POA: Diagnosis not present

## 2017-05-31 DIAGNOSIS — Z0000 Encounter for general adult medical examination without abnormal findings: Secondary | ICD-10-CM | POA: Diagnosis not present

## 2017-05-31 DIAGNOSIS — R7301 Impaired fasting glucose: Secondary | ICD-10-CM | POA: Diagnosis not present

## 2017-05-31 DIAGNOSIS — R69 Illness, unspecified: Secondary | ICD-10-CM | POA: Diagnosis not present

## 2017-05-31 DIAGNOSIS — Z1389 Encounter for screening for other disorder: Secondary | ICD-10-CM | POA: Diagnosis not present

## 2017-05-31 DIAGNOSIS — N183 Chronic kidney disease, stage 3 (moderate): Secondary | ICD-10-CM | POA: Diagnosis not present

## 2017-05-31 DIAGNOSIS — G608 Other hereditary and idiopathic neuropathies: Secondary | ICD-10-CM | POA: Diagnosis not present

## 2017-05-31 DIAGNOSIS — K219 Gastro-esophageal reflux disease without esophagitis: Secondary | ICD-10-CM | POA: Diagnosis not present

## 2017-07-13 DIAGNOSIS — H401132 Primary open-angle glaucoma, bilateral, moderate stage: Secondary | ICD-10-CM | POA: Diagnosis not present

## 2017-08-22 DIAGNOSIS — M169 Osteoarthritis of hip, unspecified: Secondary | ICD-10-CM | POA: Diagnosis not present

## 2017-08-22 DIAGNOSIS — E7800 Pure hypercholesterolemia, unspecified: Secondary | ICD-10-CM | POA: Diagnosis not present

## 2017-08-22 DIAGNOSIS — H409 Unspecified glaucoma: Secondary | ICD-10-CM | POA: Diagnosis not present

## 2017-08-22 DIAGNOSIS — N183 Chronic kidney disease, stage 3 (moderate): Secondary | ICD-10-CM | POA: Diagnosis not present

## 2017-09-22 DIAGNOSIS — N183 Chronic kidney disease, stage 3 (moderate): Secondary | ICD-10-CM | POA: Diagnosis not present

## 2017-09-22 DIAGNOSIS — M169 Osteoarthritis of hip, unspecified: Secondary | ICD-10-CM | POA: Diagnosis not present

## 2017-09-22 DIAGNOSIS — H409 Unspecified glaucoma: Secondary | ICD-10-CM | POA: Diagnosis not present

## 2017-10-11 DIAGNOSIS — Z6830 Body mass index (BMI) 30.0-30.9, adult: Secondary | ICD-10-CM | POA: Diagnosis not present

## 2017-10-11 DIAGNOSIS — Z7982 Long term (current) use of aspirin: Secondary | ICD-10-CM | POA: Diagnosis not present

## 2017-10-11 DIAGNOSIS — Z791 Long term (current) use of non-steroidal anti-inflammatories (NSAID): Secondary | ICD-10-CM | POA: Diagnosis not present

## 2017-10-11 DIAGNOSIS — K5900 Constipation, unspecified: Secondary | ICD-10-CM | POA: Diagnosis not present

## 2017-10-11 DIAGNOSIS — E669 Obesity, unspecified: Secondary | ICD-10-CM | POA: Diagnosis not present

## 2017-10-11 DIAGNOSIS — G8929 Other chronic pain: Secondary | ICD-10-CM | POA: Diagnosis not present

## 2017-10-11 DIAGNOSIS — G4700 Insomnia, unspecified: Secondary | ICD-10-CM | POA: Diagnosis not present

## 2017-10-11 DIAGNOSIS — K219 Gastro-esophageal reflux disease without esophagitis: Secondary | ICD-10-CM | POA: Diagnosis not present

## 2017-10-11 DIAGNOSIS — R030 Elevated blood-pressure reading, without diagnosis of hypertension: Secondary | ICD-10-CM | POA: Diagnosis not present

## 2017-10-11 DIAGNOSIS — H409 Unspecified glaucoma: Secondary | ICD-10-CM | POA: Diagnosis not present

## 2017-10-12 DIAGNOSIS — H401132 Primary open-angle glaucoma, bilateral, moderate stage: Secondary | ICD-10-CM | POA: Diagnosis not present

## 2017-10-13 DIAGNOSIS — H409 Unspecified glaucoma: Secondary | ICD-10-CM | POA: Diagnosis not present

## 2017-10-13 DIAGNOSIS — N183 Chronic kidney disease, stage 3 (moderate): Secondary | ICD-10-CM | POA: Diagnosis not present

## 2017-10-13 DIAGNOSIS — E7800 Pure hypercholesterolemia, unspecified: Secondary | ICD-10-CM | POA: Diagnosis not present

## 2017-10-13 DIAGNOSIS — M169 Osteoarthritis of hip, unspecified: Secondary | ICD-10-CM | POA: Diagnosis not present

## 2017-11-16 DIAGNOSIS — R69 Illness, unspecified: Secondary | ICD-10-CM | POA: Diagnosis not present

## 2017-11-30 DIAGNOSIS — R05 Cough: Secondary | ICD-10-CM | POA: Diagnosis not present

## 2017-11-30 DIAGNOSIS — R5081 Fever presenting with conditions classified elsewhere: Secondary | ICD-10-CM | POA: Diagnosis not present

## 2017-11-30 DIAGNOSIS — R000 Tachycardia, unspecified: Secondary | ICD-10-CM | POA: Diagnosis not present

## 2017-11-30 DIAGNOSIS — J0120 Acute ethmoidal sinusitis, unspecified: Secondary | ICD-10-CM | POA: Diagnosis not present

## 2017-12-04 DIAGNOSIS — G3184 Mild cognitive impairment, so stated: Secondary | ICD-10-CM | POA: Diagnosis not present

## 2017-12-04 DIAGNOSIS — E7800 Pure hypercholesterolemia, unspecified: Secondary | ICD-10-CM | POA: Diagnosis not present

## 2017-12-04 DIAGNOSIS — K219 Gastro-esophageal reflux disease without esophagitis: Secondary | ICD-10-CM | POA: Diagnosis not present

## 2017-12-08 DIAGNOSIS — H409 Unspecified glaucoma: Secondary | ICD-10-CM | POA: Diagnosis not present

## 2017-12-08 DIAGNOSIS — E7800 Pure hypercholesterolemia, unspecified: Secondary | ICD-10-CM | POA: Diagnosis not present

## 2017-12-08 DIAGNOSIS — M169 Osteoarthritis of hip, unspecified: Secondary | ICD-10-CM | POA: Diagnosis not present

## 2017-12-08 DIAGNOSIS — N183 Chronic kidney disease, stage 3 (moderate): Secondary | ICD-10-CM | POA: Diagnosis not present

## 2017-12-28 DIAGNOSIS — R69 Illness, unspecified: Secondary | ICD-10-CM | POA: Diagnosis not present

## 2018-01-12 DIAGNOSIS — H401132 Primary open-angle glaucoma, bilateral, moderate stage: Secondary | ICD-10-CM | POA: Diagnosis not present

## 2018-01-25 DIAGNOSIS — H401132 Primary open-angle glaucoma, bilateral, moderate stage: Secondary | ICD-10-CM | POA: Diagnosis not present

## 2018-03-15 IMAGING — CR DG ABDOMEN 1V
1 series · 1 of 1 positions shown · non-contrast
Comparison: 09/12/2002

CLINICAL DATA: Bloating and constipation. Last bowel movement was
[REDACTED].

EXAM:
ABDOMEN - 1 VIEW

[x abdomen supine]
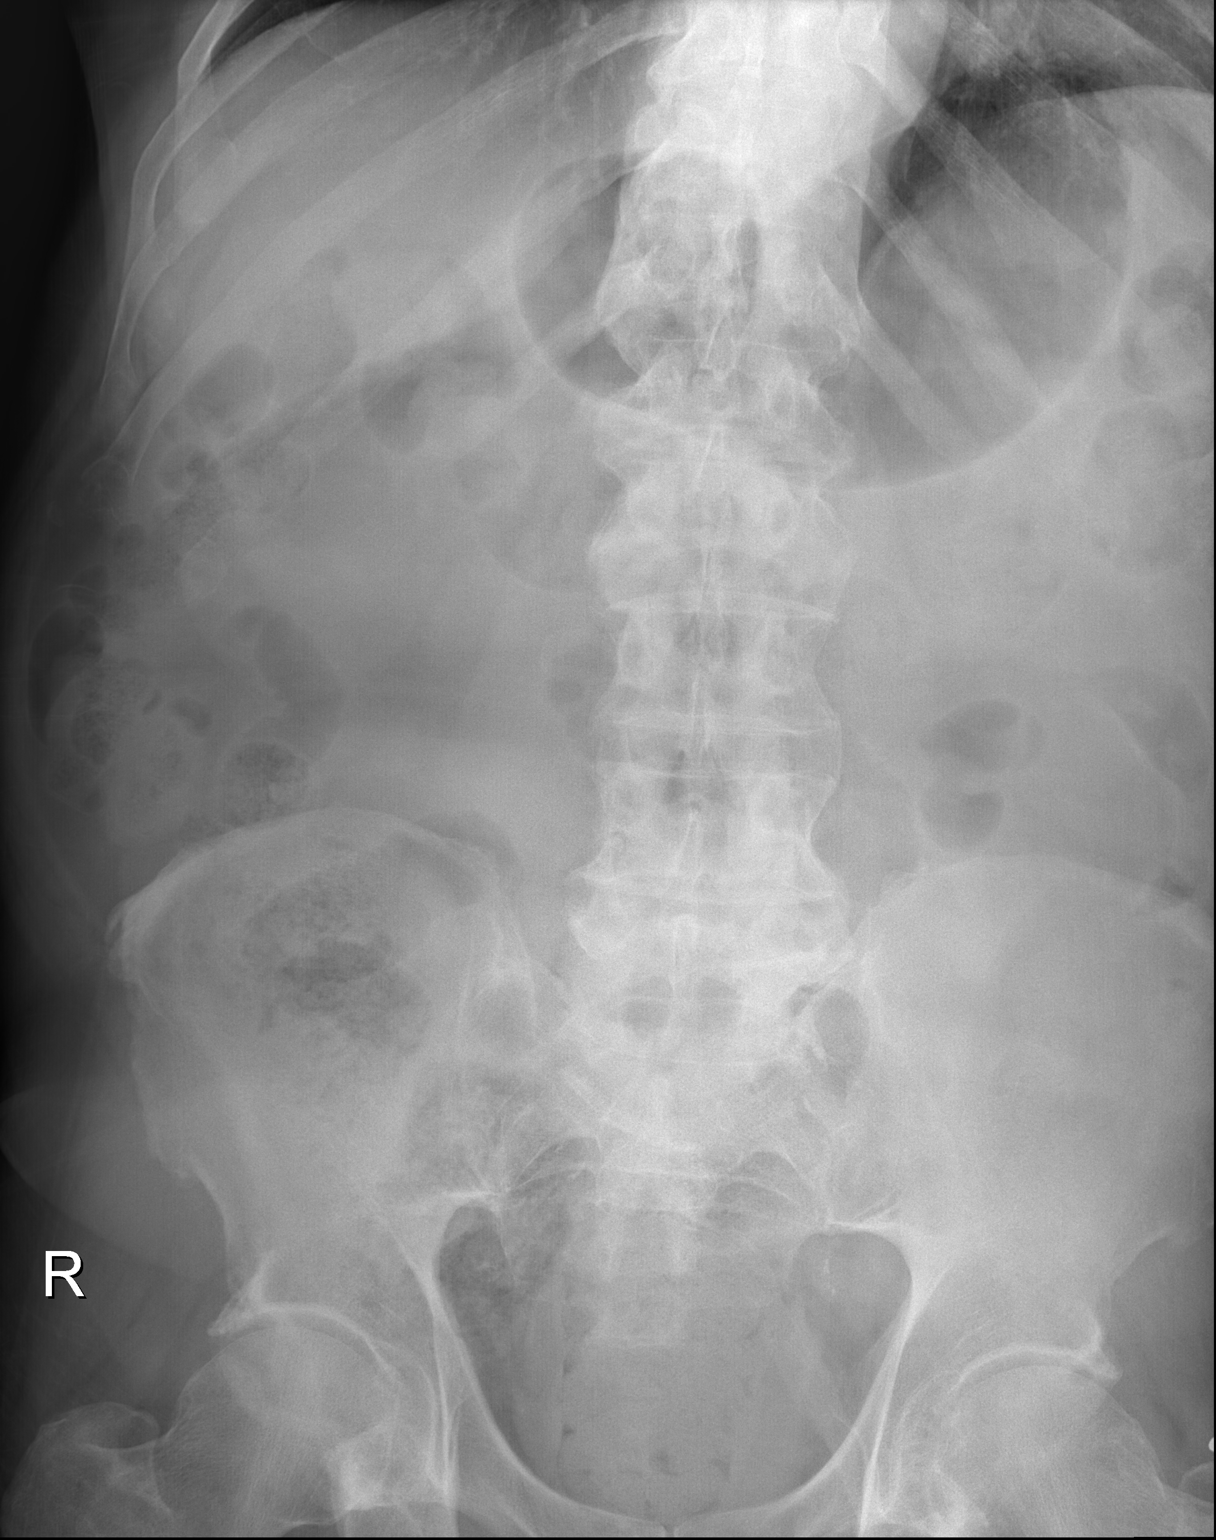

[1 of 1 positions shown; findings below may reference images not displayed]

FINDINGS: Moderate stool volume with stool intermittently seen mainly in the
ascending, distal transverse, and rectal segments. No colonic over
distension. No evidence of small bowel obstruction. No rectal over
distention to suggest impaction. Suspect sacrotuberous ligament
ossification on the left. There is confluent vertebral body
osteophytes.
IMPRESSION: Moderate stool volume without obstruction or rectal impaction.

## 2018-05-04 DIAGNOSIS — H5712 Ocular pain, left eye: Secondary | ICD-10-CM | POA: Diagnosis not present

## 2018-05-04 DIAGNOSIS — H35371 Puckering of macula, right eye: Secondary | ICD-10-CM | POA: Diagnosis not present

## 2018-05-04 DIAGNOSIS — H401132 Primary open-angle glaucoma, bilateral, moderate stage: Secondary | ICD-10-CM | POA: Diagnosis not present

## 2018-05-07 DIAGNOSIS — H5712 Ocular pain, left eye: Secondary | ICD-10-CM | POA: Diagnosis not present

## 2018-05-07 DIAGNOSIS — H401132 Primary open-angle glaucoma, bilateral, moderate stage: Secondary | ICD-10-CM | POA: Diagnosis not present

## 2018-05-07 DIAGNOSIS — H35371 Puckering of macula, right eye: Secondary | ICD-10-CM | POA: Diagnosis not present

## 2018-05-08 DIAGNOSIS — H401133 Primary open-angle glaucoma, bilateral, severe stage: Secondary | ICD-10-CM | POA: Diagnosis not present

## 2018-05-08 DIAGNOSIS — H401112 Primary open-angle glaucoma, right eye, moderate stage: Secondary | ICD-10-CM | POA: Diagnosis not present

## 2018-05-08 DIAGNOSIS — H401123 Primary open-angle glaucoma, left eye, severe stage: Secondary | ICD-10-CM | POA: Diagnosis not present

## 2018-05-15 DIAGNOSIS — H401123 Primary open-angle glaucoma, left eye, severe stage: Secondary | ICD-10-CM | POA: Diagnosis not present

## 2018-05-16 DIAGNOSIS — Z9889 Other specified postprocedural states: Secondary | ICD-10-CM | POA: Diagnosis not present

## 2018-05-16 DIAGNOSIS — H401133 Primary open-angle glaucoma, bilateral, severe stage: Secondary | ICD-10-CM | POA: Diagnosis not present

## 2018-05-23 DIAGNOSIS — H401123 Primary open-angle glaucoma, left eye, severe stage: Secondary | ICD-10-CM | POA: Diagnosis not present

## 2018-06-04 DIAGNOSIS — H401123 Primary open-angle glaucoma, left eye, severe stage: Secondary | ICD-10-CM | POA: Diagnosis not present

## 2018-06-13 DIAGNOSIS — Z48810 Encounter for surgical aftercare following surgery on the sense organs: Secondary | ICD-10-CM | POA: Diagnosis not present

## 2018-06-13 DIAGNOSIS — Z79899 Other long term (current) drug therapy: Secondary | ICD-10-CM | POA: Diagnosis not present

## 2018-06-13 DIAGNOSIS — H401133 Primary open-angle glaucoma, bilateral, severe stage: Secondary | ICD-10-CM | POA: Diagnosis not present

## 2018-06-13 DIAGNOSIS — Z9889 Other specified postprocedural states: Secondary | ICD-10-CM | POA: Diagnosis not present

## 2018-06-15 DIAGNOSIS — H401123 Primary open-angle glaucoma, left eye, severe stage: Secondary | ICD-10-CM | POA: Diagnosis not present

## 2018-06-26 DIAGNOSIS — Z48810 Encounter for surgical aftercare following surgery on the sense organs: Secondary | ICD-10-CM | POA: Diagnosis not present

## 2018-06-26 DIAGNOSIS — H401123 Primary open-angle glaucoma, left eye, severe stage: Secondary | ICD-10-CM | POA: Diagnosis not present

## 2018-07-25 DIAGNOSIS — Z79899 Other long term (current) drug therapy: Secondary | ICD-10-CM | POA: Diagnosis not present

## 2018-07-25 DIAGNOSIS — Z48810 Encounter for surgical aftercare following surgery on the sense organs: Secondary | ICD-10-CM | POA: Diagnosis not present

## 2018-07-25 DIAGNOSIS — H401123 Primary open-angle glaucoma, left eye, severe stage: Secondary | ICD-10-CM | POA: Diagnosis not present

## 2018-07-25 DIAGNOSIS — Z9889 Other specified postprocedural states: Secondary | ICD-10-CM | POA: Diagnosis not present

## 2018-07-25 DIAGNOSIS — H401112 Primary open-angle glaucoma, right eye, moderate stage: Secondary | ICD-10-CM | POA: Diagnosis not present

## 2018-08-24 DIAGNOSIS — Z9889 Other specified postprocedural states: Secondary | ICD-10-CM | POA: Diagnosis not present

## 2018-08-24 DIAGNOSIS — H401112 Primary open-angle glaucoma, right eye, moderate stage: Secondary | ICD-10-CM | POA: Diagnosis not present

## 2018-08-24 DIAGNOSIS — H401123 Primary open-angle glaucoma, left eye, severe stage: Secondary | ICD-10-CM | POA: Diagnosis not present

## 2018-09-27 DIAGNOSIS — R69 Illness, unspecified: Secondary | ICD-10-CM | POA: Diagnosis not present

## 2018-10-01 DIAGNOSIS — Z0000 Encounter for general adult medical examination without abnormal findings: Secondary | ICD-10-CM | POA: Diagnosis not present

## 2018-10-01 DIAGNOSIS — Z1389 Encounter for screening for other disorder: Secondary | ICD-10-CM | POA: Diagnosis not present

## 2018-11-01 DIAGNOSIS — G608 Other hereditary and idiopathic neuropathies: Secondary | ICD-10-CM | POA: Diagnosis not present

## 2018-11-01 DIAGNOSIS — G3184 Mild cognitive impairment, so stated: Secondary | ICD-10-CM | POA: Diagnosis not present

## 2018-11-01 DIAGNOSIS — E7800 Pure hypercholesterolemia, unspecified: Secondary | ICD-10-CM | POA: Diagnosis not present

## 2018-11-01 DIAGNOSIS — R7301 Impaired fasting glucose: Secondary | ICD-10-CM | POA: Diagnosis not present

## 2018-11-01 DIAGNOSIS — K219 Gastro-esophageal reflux disease without esophagitis: Secondary | ICD-10-CM | POA: Diagnosis not present

## 2018-11-01 DIAGNOSIS — E781 Pure hyperglyceridemia: Secondary | ICD-10-CM | POA: Diagnosis not present

## 2018-11-01 DIAGNOSIS — E782 Mixed hyperlipidemia: Secondary | ICD-10-CM | POA: Diagnosis not present

## 2018-11-01 DIAGNOSIS — R202 Paresthesia of skin: Secondary | ICD-10-CM | POA: Diagnosis not present

## 2018-11-01 DIAGNOSIS — N183 Chronic kidney disease, stage 3 (moderate): Secondary | ICD-10-CM | POA: Diagnosis not present

## 2018-11-05 ENCOUNTER — Other Ambulatory Visit: Payer: Self-pay | Admitting: Internal Medicine

## 2018-11-05 DIAGNOSIS — R202 Paresthesia of skin: Secondary | ICD-10-CM

## 2018-11-23 DIAGNOSIS — H401112 Primary open-angle glaucoma, right eye, moderate stage: Secondary | ICD-10-CM | POA: Diagnosis not present

## 2018-11-23 DIAGNOSIS — Z79899 Other long term (current) drug therapy: Secondary | ICD-10-CM | POA: Diagnosis not present

## 2018-11-23 DIAGNOSIS — H401133 Primary open-angle glaucoma, bilateral, severe stage: Secondary | ICD-10-CM | POA: Diagnosis not present

## 2018-11-23 DIAGNOSIS — H401123 Primary open-angle glaucoma, left eye, severe stage: Secondary | ICD-10-CM | POA: Diagnosis not present

## 2018-11-26 DIAGNOSIS — E7800 Pure hypercholesterolemia, unspecified: Secondary | ICD-10-CM | POA: Diagnosis not present

## 2018-11-26 DIAGNOSIS — M169 Osteoarthritis of hip, unspecified: Secondary | ICD-10-CM | POA: Diagnosis not present

## 2018-11-26 DIAGNOSIS — N183 Chronic kidney disease, stage 3 (moderate): Secondary | ICD-10-CM | POA: Diagnosis not present

## 2018-11-26 DIAGNOSIS — H409 Unspecified glaucoma: Secondary | ICD-10-CM | POA: Diagnosis not present

## 2018-11-29 ENCOUNTER — Other Ambulatory Visit: Payer: Medicare HMO

## 2018-11-30 DIAGNOSIS — M545 Low back pain: Secondary | ICD-10-CM | POA: Diagnosis not present

## 2018-11-30 DIAGNOSIS — R29898 Other symptoms and signs involving the musculoskeletal system: Secondary | ICD-10-CM | POA: Diagnosis not present

## 2018-11-30 DIAGNOSIS — R531 Weakness: Secondary | ICD-10-CM | POA: Diagnosis not present

## 2018-11-30 DIAGNOSIS — R262 Difficulty in walking, not elsewhere classified: Secondary | ICD-10-CM | POA: Diagnosis not present

## 2018-12-28 DIAGNOSIS — R531 Weakness: Secondary | ICD-10-CM | POA: Diagnosis not present

## 2019-01-14 DIAGNOSIS — N183 Chronic kidney disease, stage 3 unspecified: Secondary | ICD-10-CM | POA: Diagnosis not present

## 2019-01-14 DIAGNOSIS — M169 Osteoarthritis of hip, unspecified: Secondary | ICD-10-CM | POA: Diagnosis not present

## 2019-01-14 DIAGNOSIS — E7800 Pure hypercholesterolemia, unspecified: Secondary | ICD-10-CM | POA: Diagnosis not present

## 2019-01-14 DIAGNOSIS — H409 Unspecified glaucoma: Secondary | ICD-10-CM | POA: Diagnosis not present

## 2019-03-08 ENCOUNTER — Encounter (HOSPITAL_COMMUNITY): Payer: Self-pay | Admitting: Emergency Medicine

## 2019-03-08 ENCOUNTER — Other Ambulatory Visit: Payer: Self-pay

## 2019-03-08 ENCOUNTER — Inpatient Hospital Stay (HOSPITAL_COMMUNITY)
Admission: EM | Admit: 2019-03-08 | Discharge: 2019-03-11 | DRG: 682 | Disposition: A | Discharge status: Home Health | Payer: Medicare HMO | Attending: Family Medicine | Admitting: Family Medicine

## 2019-03-08 DIAGNOSIS — K219 Gastro-esophageal reflux disease without esophagitis: Secondary | ICD-10-CM | POA: Diagnosis not present

## 2019-03-08 DIAGNOSIS — E7439 Other disorders of intestinal carbohydrate absorption: Secondary | ICD-10-CM | POA: Diagnosis present

## 2019-03-08 DIAGNOSIS — H409 Unspecified glaucoma: Secondary | ICD-10-CM | POA: Diagnosis not present

## 2019-03-08 DIAGNOSIS — Z833 Family history of diabetes mellitus: Secondary | ICD-10-CM | POA: Diagnosis not present

## 2019-03-08 DIAGNOSIS — R350 Frequency of micturition: Secondary | ICD-10-CM | POA: Diagnosis not present

## 2019-03-08 DIAGNOSIS — Z03818 Encounter for observation for suspected exposure to other biological agents ruled out: Secondary | ICD-10-CM | POA: Diagnosis not present

## 2019-03-08 DIAGNOSIS — Z7982 Long term (current) use of aspirin: Secondary | ICD-10-CM | POA: Diagnosis not present

## 2019-03-08 DIAGNOSIS — M160 Bilateral primary osteoarthritis of hip: Secondary | ICD-10-CM | POA: Diagnosis present

## 2019-03-08 DIAGNOSIS — M1990 Unspecified osteoarthritis, unspecified site: Secondary | ICD-10-CM | POA: Diagnosis not present

## 2019-03-08 DIAGNOSIS — Z20828 Contact with and (suspected) exposure to other viral communicable diseases: Secondary | ICD-10-CM | POA: Diagnosis not present

## 2019-03-08 DIAGNOSIS — R410 Disorientation, unspecified: Secondary | ICD-10-CM | POA: Diagnosis not present

## 2019-03-08 DIAGNOSIS — G4733 Obstructive sleep apnea (adult) (pediatric): Secondary | ICD-10-CM | POA: Diagnosis not present

## 2019-03-08 DIAGNOSIS — N200 Calculus of kidney: Secondary | ICD-10-CM | POA: Diagnosis not present

## 2019-03-08 DIAGNOSIS — N136 Pyonephrosis: Secondary | ICD-10-CM | POA: Diagnosis present

## 2019-03-08 DIAGNOSIS — F0390 Unspecified dementia without behavioral disturbance: Secondary | ICD-10-CM | POA: Diagnosis present

## 2019-03-08 DIAGNOSIS — G629 Polyneuropathy, unspecified: Secondary | ICD-10-CM | POA: Diagnosis present

## 2019-03-08 DIAGNOSIS — N390 Urinary tract infection, site not specified: Secondary | ICD-10-CM | POA: Diagnosis not present

## 2019-03-08 DIAGNOSIS — Z8249 Family history of ischemic heart disease and other diseases of the circulatory system: Secondary | ICD-10-CM | POA: Diagnosis not present

## 2019-03-08 DIAGNOSIS — E860 Dehydration: Secondary | ICD-10-CM | POA: Diagnosis present

## 2019-03-08 DIAGNOSIS — G9341 Metabolic encephalopathy: Secondary | ICD-10-CM | POA: Diagnosis not present

## 2019-03-08 DIAGNOSIS — R1031 Right lower quadrant pain: Secondary | ICD-10-CM | POA: Diagnosis not present

## 2019-03-08 DIAGNOSIS — N3001 Acute cystitis with hematuria: Secondary | ICD-10-CM

## 2019-03-08 DIAGNOSIS — R69 Illness, unspecified: Secondary | ICD-10-CM | POA: Diagnosis not present

## 2019-03-08 DIAGNOSIS — N1831 Chronic kidney disease, stage 3a: Secondary | ICD-10-CM | POA: Diagnosis not present

## 2019-03-08 DIAGNOSIS — E785 Hyperlipidemia, unspecified: Secondary | ICD-10-CM | POA: Diagnosis present

## 2019-03-08 DIAGNOSIS — G3184 Mild cognitive impairment, so stated: Secondary | ICD-10-CM | POA: Diagnosis not present

## 2019-03-08 DIAGNOSIS — N132 Hydronephrosis with renal and ureteral calculous obstruction: Secondary | ICD-10-CM | POA: Diagnosis not present

## 2019-03-08 DIAGNOSIS — N179 Acute kidney failure, unspecified: Principal | ICD-10-CM | POA: Diagnosis present

## 2019-03-08 DIAGNOSIS — R4182 Altered mental status, unspecified: Secondary | ICD-10-CM | POA: Diagnosis present

## 2019-03-08 LAB — URINALYSIS, ROUTINE W REFLEX MICROSCOPIC
Bilirubin Urine: NEGATIVE
Glucose, UA: NEGATIVE mg/dL
Ketones, ur: NEGATIVE mg/dL
Leukocytes,Ua: NEGATIVE
Nitrite: POSITIVE — AB
Protein, ur: 30 mg/dL — AB
RBC / HPF: >50 RBC/hpf — ABNORMAL HIGH (ref 0–5)
Specific Gravity, Urine: 1.028 (ref 1.005–1.030)
pH: 5 (ref 5.0–8.0)

## 2019-03-08 LAB — COMPREHENSIVE METABOLIC PANEL
ALT: 33 U/L (ref 0–44)
AST: 43 U/L — ABNORMAL HIGH (ref 15–41)
Albumin: 3.3 g/dL — ABNORMAL LOW (ref 3.5–5.0)
Alkaline Phosphatase: 44 U/L (ref 38–126)
Anion gap: 12 (ref 5–15)
BUN: 52 mg/dL — ABNORMAL HIGH (ref 8–23)
CO2: 20 mmol/L — ABNORMAL LOW (ref 22–32)
Calcium: 10.1 mg/dL (ref 8.9–10.3)
Chloride: 109 mmol/L (ref 98–111)
Creatinine, Ser: 2.32 mg/dL — ABNORMAL HIGH (ref 0.61–1.24)
GFR calc Af Amer: 29 mL/min — ABNORMAL LOW (ref >60)
GFR calc non Af Amer: 25 mL/min — ABNORMAL LOW (ref >60)
Glucose, Bld: 163 mg/dL — ABNORMAL HIGH (ref 70–99)
Potassium: 4 mmol/L (ref 3.5–5.1)
Sodium: 141 mmol/L (ref 135–145)
Total Bilirubin: 1.1 mg/dL (ref 0.3–1.2)
Total Protein: 7.1 g/dL (ref 6.5–8.1)

## 2019-03-08 LAB — CBC
HCT: 44.7 % (ref 39.0–52.0)
Hemoglobin: 14.5 g/dL (ref 13.0–17.0)
MCH: 28.3 pg (ref 26.0–34.0)
MCHC: 32.4 g/dL (ref 30.0–36.0)
MCV: 87.3 fL (ref 80.0–100.0)
Platelets: 189 10*3/uL (ref 150–400)
RBC: 5.12 MIL/uL (ref 4.22–5.81)
RDW: 13.5 % (ref 11.5–15.5)
WBC: 9 10*3/uL (ref 4.0–10.5)
nRBC: 0 % (ref 0.0–0.2)

## 2019-03-08 MED ORDER — SODIUM CHLORIDE 0.9% FLUSH: 3.0000 mL | Freq: Once | INTRAVENOUS | Status: DC

## 2019-03-08 MED ORDER — SODIUM CHLORIDE 0.9 % IV BOLUS
1000.0000 mL | Freq: Once | INTRAVENOUS | Status: AC
  Administered 2019-03-09: 1000 mL via INTRAVENOUS

## 2019-03-08 MED ORDER — SODIUM CHLORIDE 0.9 % IV SOLN
1.0000 g | Freq: Once | INTRAVENOUS | Status: AC
  Administered 2019-03-09: 1 g via INTRAVENOUS
  Filled 2019-03-08: qty 10

## 2019-03-08 NOTE — ED Triage Notes (Signed)
Pt arrives with wife from PCP office: wife reports worsening of confusion and actually got lost on Wednesday and family had to activate a silver alert. Pcp wanted to rule out uti.  Wife reports patient is having "worms" in urine.

## 2019-03-08 NOTE — ED Provider Notes (Signed)
[facility] EMERGENCY DEPARTMENT Provider Note   CSN: [id number] Arrival date & time: 03/08/19  1608     History Chief Complaint  Patient presents with  . Altered Mental Status    Mathew Ochoa is a 85 y.o. male.  Patient with history of GERD, arthritis, OSA, to ED with wife who provides most of the history secondary to patient's confused mental status. He has history of dementia that she describes as mild confusion for "years". This week, there has been a marked increase of confusion as exampled by his getting lost while driving 2 days ago, missing for 7 hours, found in [place]. No known fever, vomiting, cough, SOB. No head injury, facial asymmetry, unsteady gait or dysarthria. Wife states she sees "worms" in his urine. The patient was taken to his doctor's office today and sent here for concern of urosepsis. The patient has complained of right sided and RLQ abdominal pain for 2 days. He has not been eating and he is drinking only small amounts of fluid.  The history is provided by the patient. No language interpreter was used.  Altered Mental Status Presenting symptoms: confusion   Associated symptoms: abdominal pain   Associated symptoms: no fever, no vomiting and no weakness        Past Medical History:  Diagnosis Date  . Adenomatous colon polyp   . Arthritis   . Concussion   . Constipation   . 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 Diplopia   . Facial palsy   . GERD (gastroesophageal reflux disease)   . Glaucoma   . Impaired fasting glucose   . Lower back pain   . OSA (obstructive sleep apnea)   . Osteoarthritis of both hips   . Peripheral neuropathy   . Skin lesions     Patient Active Problem List   Diagnosis Date Noted  . Neuropathy 07/19/2012    Past Surgical History:  Procedure Laterality Date  . ANKLE SURGERY    . APPENDECTOMY     84 yrs old  . APPENDECTOMY    . COLONOSCOPY WITH PROPOFOL  02/14/2012   Procedure:  COLONOSCOPY WITH PROPOFOL;  Surgeon: Garlan Fair, MD;  Location: [facility];  Service: Endoscopy;  Laterality: N/A;  katrina/leone  . inguinal heniorrhaphy    . IRRIGATION AND DEBRIDEMENT SEBACEOUS CYST    . right leg surgery for fracture  yrs ago   pin later removed       Family History  Problem Relation Age of Onset  . Heart failure Mother   . Diabetes Father   . Heart disease Father   . Heart disease Brother   . Heart disease Brother   . Cancer - Other Daughter   . Cancer - Colon Brother   . Cancer - Prostate Brother     Social History   Tobacco Use  . Smoking status: Never Smoker  . Smokeless tobacco: Never Used  Substance Use Topics  . Alcohol use: No  . Drug use: No    Home Medications Prior to Admission medications   Medication Sig Start Date End Date Taking? Authorizing Provider  acetaZOLAMIDE (DIAMOX) 500 MG capsule Take 500 mg by mouth every 12 (twelve) hours.  02/10/17   [provider]  aspirin EC 81 MG tablet Take 81 mg by mouth every morning.    [provider]  bisacodyl (BISACODYL) 5 MG EC tablet Take 5 mg by mouth daily as needed for moderate constipation.  [provider]  brimonidine (ALPHAGAN P) 0.1 % SOLN     [provider]  Cholecalciferol (VITAMIN D3) 1000 UNITS CAPS Take 1,000 Units by mouth daily.    [provider]  Ibuprofen-Diphenhydramine HCl (ADVIL PM) 200-25 MG CAPS Take 1 tablet by mouth at bedtime.    [provider]  lisinopril-hydrochlorothiazide (PRINZIDE,ZESTORETIC) 10-12.5 MG per tablet Take 1 tablet by mouth daily.    [provider]  naproxen sodium (ANAPROX) 220 MG tablet Take 220 mg by mouth 2 (two) times daily with a meal.    [provider]  omeprazole (PRILOSEC) 20 MG capsule Take 20 mg by mouth daily as needed (stomach pain).     [provider]  simvastatin (ZOCOR) 10 MG tablet Take 10 mg by mouth daily.    [provider]    simvastatin (ZOCOR) 20 MG tablet Take 10 mg by mouth at bedtime.    [provider]  timolol (TIMOPTIC) 0.5 % ophthalmic solution Place 1 drop into both eyes 2 (two) times daily.    [provider]  Travoprost, BAK Free, (TRAVATAN) 0.004 % SOLN ophthalmic solution Place 1 drop into both eyes at bedtime.    [provider]    Allergies    Patient has no known allergies.  Review of Systems   Review of Systems  Constitutional: Positive for appetite change. Negative for chills and fever.  HENT: Negative.  Negative for congestion and trouble swallowing.   Respiratory: Negative.  Negative for cough and shortness of breath.   Cardiovascular: Negative.  Negative for chest pain.  Gastrointestinal: Positive for abdominal pain. Negative for vomiting.  Genitourinary:       See HPI  Musculoskeletal: Negative.  Negative for neck stiffness.  Skin: Negative.  Negative for color change and wound.  Neurological: Negative.  Negative for syncope, facial asymmetry, speech difficulty, weakness and numbness.  Psychiatric/Behavioral: Positive for confusion.    Physical Exam Updated Vital Signs BP (!) 150/80 (BP Location: Left Arm)   Pulse 88   Temp 98.1 F (36.7 C) (Oral)   Resp 18   SpO2 96%   Physical Exam Vitals and nursing note reviewed.  Constitutional:      General: He is not in acute distress. HENT:     Head: Normocephalic and atraumatic.     Mouth/Throat:     Mouth: Mucous membranes are moist.  Eyes:     Conjunctiva/sclera: Conjunctivae normal.  Cardiovascular:     Rate and Rhythm: Normal rate and regular rhythm.     Heart sounds: No murmur.  Pulmonary:     Effort: Pulmonary effort is normal.     Breath sounds: Normal breath sounds. No wheezing, rhonchi or rales.  Abdominal:     General: Abdomen is flat.     Tenderness: There is no abdominal tenderness.     Comments: Specifically no tenderness on exam to RLQ or flank.  Musculoskeletal:        General:  Normal range of motion.     Cervical back: Normal range of motion and neck supple.  Skin:    General: Skin is warm and dry.  Neurological:     Mental Status: He is alert. He is disoriented and confused.     GCS: GCS eye subscore is 4. GCS verbal subscore is 4. GCS motor subscore is 5.     Cranial Nerves: No dysarthria or facial asymmetry.     Sensory: Sensation is intact.     Motor: No  weakness or tremor.     Deep Tendon Reflexes:     Reflex Scores:      Patellar reflexes are 1+ on the right side and 1+ on the left side.    Comments: Patient pleasantly confused, disoriented to place and time. Unable to coordinate putting on the hospital gown, "I can't figure it out". CN's 3-12 grossly intact.     ED Results / Procedures / Treatments   Labs (all labs ordered are listed, but only abnormal results are displayed) Labs Reviewed  COMPREHENSIVE METABOLIC PANEL - Abnormal; Notable for the following components:      Result Value   CO2 20 (*)    Glucose, Bld 163 (*)    BUN 52 (*)    Creatinine, Ser 2.32 (*)    Albumin 3.3 (*)    AST 43 (*)    GFR calc non Af Amer 25 (*)    GFR calc Af Amer 29 (*)    All other components within normal limits  URINALYSIS, ROUTINE W REFLEX MICROSCOPIC - Abnormal; Notable for the following components:   Color, Urine AMBER (*)    APPearance HAZY (*)    Hgb urine dipstick LARGE (*)    Protein, ur 30 (*)    Nitrite POSITIVE (*)    RBC / HPF >50 (*)    Bacteria, UA RARE (*)    All other components within normal limits  CULTURE, BLOOD (ROUTINE X 2)  CULTURE, BLOOD (ROUTINE X 2)  URINE CULTURE  SARS CORONAVIRUS 2 (TAT 6-24 HRS)  CBC  LACTIC ACID, PLASMA  LACTIC ACID, PLASMA  CBG MONITORING, ED    EKG None  Radiology No results found.  Procedures Procedures (including critical care time)  Medications Ordered in ED Medications  sodium chloride flush (NS) 0.9 % injection 3 mL (has no administration in time range)  sodium chloride 0.9 % bolus  1,000 mL (has no administration in time range)  cefTRIAXone (ROCEPHIN) 1 g in sodium chloride 0.9 % 100 mL IVPB (has no administration in time range)    ED Course  I have reviewed the triage vital signs and the nursing notes.  Pertinent labs & imaging results that were available during my care of the patient were reviewed by me and considered in my medical decision making (see chart for details).    MDM Rules/Calculators/A&P     CHA2DS2/VAS Stroke Risk Points      N/A >= 2 Points: High Risk  1 - 1.99 Points: Medium Risk  0 Points: Low Risk    A final score could not be computed because of missing components.: Last  Change: N/A     This score determines the patient's risk of having a stroke if the  patient has atrial fibrillation.      This score is not applicable to this patient. Components are not  calculated.                   Patient to ED with sharp increase in baseline confusion in the last 3 days, abnormal urine, c/o abdominal pain.  Labs reviewed. There is evidence of UTI, with hematuria. Cr elevated at 2.32, BUN 52. Wife unaware of any kidney disease history. He states his right abdomen hurts but has a benign exam. VSS, afebrile.   IV Rocephin, fluids started. Sepsis work up initiated though no code sepsis given absence of SIRS criteria - no fever, tachycardia, hypotension, hypoxia. Plan on admission when treatment underway. CT head ordered for  evaluation of confusion, CT renal study to evaluate for stone given c/o pain, hematuria, renal insufficiency, UTI.  CT head shows chronic changes only - no acute findings. CT renal shows that he has passed a single stone into the bladder. There is moderate hydro, distal ureter edema.   Cultures pending. On re-evaluation, the patient is becoming impatient and mildly agitated. Blood pressure elevated. Will provide small dose Ativan.   Discussed admission with Dr. Maudie Mercury who accepts the patient for admission.  Final Clinical Impression(s)  / ED Diagnoses Final diagnoses:  None   1. UTI 2. Kidney stone, right 3. AKI 4. Acute confusion  Rx / DC Orders ED Discharge Orders    None       Charlann Lange, PA-C 03/09/19 0124    Ward, Delice Bison, DO 03/09/19 0126

## 2019-03-08 NOTE — ED Notes (Signed)
Pt wife is gong to be with pt during stay. Pt wife can only here better out of her left ear so please talk to her on the left side. Pt son  Antoniodejesus Cantley  [phone] please call with updates.

## 2019-03-09 ENCOUNTER — Emergency Department (HOSPITAL_COMMUNITY): Payer: Medicare HMO

## 2019-03-09 ENCOUNTER — Other Ambulatory Visit: Payer: Self-pay

## 2019-03-09 ENCOUNTER — Encounter (HOSPITAL_COMMUNITY): Payer: Self-pay | Admitting: Internal Medicine

## 2019-03-09 DIAGNOSIS — K219 Gastro-esophageal reflux disease without esophagitis: Secondary | ICD-10-CM | POA: Diagnosis not present

## 2019-03-09 DIAGNOSIS — G4733 Obstructive sleep apnea (adult) (pediatric): Secondary | ICD-10-CM | POA: Diagnosis not present

## 2019-03-09 DIAGNOSIS — N200 Calculus of kidney: Secondary | ICD-10-CM

## 2019-03-09 DIAGNOSIS — Z20828 Contact with and (suspected) exposure to other viral communicable diseases: Secondary | ICD-10-CM | POA: Diagnosis not present

## 2019-03-09 DIAGNOSIS — R4182 Altered mental status, unspecified: Secondary | ICD-10-CM | POA: Diagnosis present

## 2019-03-09 DIAGNOSIS — E785 Hyperlipidemia, unspecified: Secondary | ICD-10-CM | POA: Diagnosis present

## 2019-03-09 DIAGNOSIS — Z8249 Family history of ischemic heart disease and other diseases of the circulatory system: Secondary | ICD-10-CM | POA: Diagnosis not present

## 2019-03-09 DIAGNOSIS — N3001 Acute cystitis with hematuria: Secondary | ICD-10-CM

## 2019-03-09 DIAGNOSIS — N132 Hydronephrosis with renal and ureteral calculous obstruction: Secondary | ICD-10-CM | POA: Diagnosis not present

## 2019-03-09 DIAGNOSIS — Z833 Family history of diabetes mellitus: Secondary | ICD-10-CM | POA: Diagnosis not present

## 2019-03-09 DIAGNOSIS — N390 Urinary tract infection, site not specified: Secondary | ICD-10-CM | POA: Diagnosis present

## 2019-03-09 DIAGNOSIS — N136 Pyonephrosis: Secondary | ICD-10-CM | POA: Diagnosis not present

## 2019-03-09 DIAGNOSIS — E7439 Other disorders of intestinal carbohydrate absorption: Secondary | ICD-10-CM | POA: Diagnosis present

## 2019-03-09 DIAGNOSIS — G9341 Metabolic encephalopathy: Secondary | ICD-10-CM | POA: Diagnosis not present

## 2019-03-09 DIAGNOSIS — E860 Dehydration: Secondary | ICD-10-CM | POA: Diagnosis present

## 2019-03-09 DIAGNOSIS — N179 Acute kidney failure, unspecified: Secondary | ICD-10-CM | POA: Diagnosis present

## 2019-03-09 DIAGNOSIS — F0390 Unspecified dementia without behavioral disturbance: Secondary | ICD-10-CM | POA: Diagnosis present

## 2019-03-09 DIAGNOSIS — R69 Illness, unspecified: Secondary | ICD-10-CM | POA: Diagnosis not present

## 2019-03-09 DIAGNOSIS — G629 Polyneuropathy, unspecified: Secondary | ICD-10-CM | POA: Diagnosis present

## 2019-03-09 DIAGNOSIS — M160 Bilateral primary osteoarthritis of hip: Secondary | ICD-10-CM | POA: Diagnosis not present

## 2019-03-09 DIAGNOSIS — R410 Disorientation, unspecified: Secondary | ICD-10-CM | POA: Diagnosis not present

## 2019-03-09 DIAGNOSIS — H409 Unspecified glaucoma: Secondary | ICD-10-CM | POA: Diagnosis not present

## 2019-03-09 DIAGNOSIS — Z7982 Long term (current) use of aspirin: Secondary | ICD-10-CM | POA: Diagnosis not present

## 2019-03-09 DIAGNOSIS — M1990 Unspecified osteoarthritis, unspecified site: Secondary | ICD-10-CM | POA: Diagnosis not present

## 2019-03-09 LAB — COMPREHENSIVE METABOLIC PANEL
ALT: 31 U/L (ref 0–44)
AST: 33 U/L (ref 15–41)
Albumin: 2.9 g/dL — ABNORMAL LOW (ref 3.5–5.0)
Alkaline Phosphatase: 34 U/L — ABNORMAL LOW (ref 38–126)
Anion gap: 13 (ref 5–15)
BUN: 52 mg/dL — ABNORMAL HIGH (ref 8–23)
CO2: 23 mmol/L (ref 22–32)
Calcium: 9.9 mg/dL (ref 8.9–10.3)
Chloride: 109 mmol/L (ref 98–111)
Creatinine, Ser: 2.32 mg/dL — ABNORMAL HIGH (ref 0.61–1.24)
GFR calc Af Amer: 29 mL/min — ABNORMAL LOW (ref >60)
GFR calc non Af Amer: 25 mL/min — ABNORMAL LOW (ref >60)
Glucose, Bld: 113 mg/dL — ABNORMAL HIGH (ref 70–99)
Potassium: 4.4 mmol/L (ref 3.5–5.1)
Sodium: 145 mmol/L (ref 135–145)
Total Bilirubin: 0.9 mg/dL (ref 0.3–1.2)
Total Protein: 6.6 g/dL (ref 6.5–8.1)

## 2019-03-09 LAB — CBC WITH DIFFERENTIAL/PLATELET
Abs Immature Granulocytes: 0.04 10*3/uL (ref 0.00–0.07)
Basophils Absolute: 0.1 10*3/uL (ref 0.0–0.1)
Basophils Relative: 1 %
Eosinophils Absolute: 0.1 10*3/uL (ref 0.0–0.5)
Eosinophils Relative: 2 %
HCT: 42 % (ref 39.0–52.0)
Hemoglobin: 13.6 g/dL (ref 13.0–17.0)
Immature Granulocytes: 1 %
Lymphocytes Relative: 16 %
Lymphs Abs: 1.4 10*3/uL (ref 0.7–4.0)
MCH: 28.3 pg (ref 26.0–34.0)
MCHC: 32.4 g/dL (ref 30.0–36.0)
MCV: 87.3 fL (ref 80.0–100.0)
Monocytes Absolute: 1.6 10*3/uL — ABNORMAL HIGH (ref 0.1–1.0)
Monocytes Relative: 19 %
Neutro Abs: 5.4 10*3/uL (ref 1.7–7.7)
Neutrophils Relative %: 61 %
Platelets: 174 10*3/uL (ref 150–400)
RBC: 4.81 MIL/uL (ref 4.22–5.81)
RDW: 13.5 % (ref 11.5–15.5)
WBC: 8.6 10*3/uL (ref 4.0–10.5)
nRBC: 0 % (ref 0.0–0.2)

## 2019-03-09 LAB — LACTIC ACID, PLASMA
Lactic Acid, Venous: 1.1 mmol/L (ref 0.5–1.9)
Lactic Acid, Venous: 1.4 mmol/L (ref 0.5–1.9)

## 2019-03-09 LAB — SARS CORONAVIRUS 2 (TAT 6-24 HRS): SARS Coronavirus 2: NEGATIVE

## 2019-03-09 MED ORDER — SODIUM CHLORIDE 0.9 % IV SOLN
INTRAVENOUS | Status: AC
  Administered 2019-03-09: 06:00:00 via INTRAVENOUS

## 2019-03-09 MED ORDER — AMLODIPINE BESYLATE 10 MG PO TABS
10.0000 mg | ORAL_TABLET | Freq: Every day | ORAL | Status: DC
  Administered 2019-03-09 – 2019-03-10 (×2): 10 mg via ORAL
  Filled 2019-03-09 (×2): qty 1

## 2019-03-09 MED ORDER — PANTOPRAZOLE SODIUM 40 MG PO TBEC
40.0000 mg | DELAYED_RELEASE_TABLET | Freq: Every day | ORAL | Status: DC
  Administered 2019-03-09 – 2019-03-11 (×3): 40 mg via ORAL
  Filled 2019-03-09 (×3): qty 1

## 2019-03-09 MED ORDER — ACETAMINOPHEN 650 MG RE SUPP: 650.0000 mg | Freq: Four times a day (QID) | RECTAL | Status: DC | PRN

## 2019-03-09 MED ORDER — ACETAMINOPHEN 325 MG PO TABS: 650.0000 mg | ORAL_TABLET | Freq: Four times a day (QID) | ORAL | Status: DC | PRN

## 2019-03-09 MED ORDER — SODIUM CHLORIDE 0.9 % IV SOLN
INTRAVENOUS | Status: DC
  Administered 2019-03-09 (×2): via INTRAVENOUS

## 2019-03-09 MED ORDER — ENOXAPARIN SODIUM 30 MG/0.3ML ~~LOC~~ SOLN
30.0000 mg | SUBCUTANEOUS | Status: DC
  Administered 2019-03-09 – 2019-03-10 (×2): 30 mg via SUBCUTANEOUS
  Filled 2019-03-09 (×3): qty 0.3

## 2019-03-09 MED ORDER — SODIUM CHLORIDE 0.9 % IV SOLN
1.0000 g | INTRAVENOUS | Status: DC
  Administered 2019-03-09: 1 g via INTRAVENOUS
  Filled 2019-03-09: qty 1
  Filled 2019-03-09: qty 10

## 2019-03-09 NOTE — Progress Notes (Signed)
Patient seen and examined, admitted earlier this morning by Dr. Maudie Mercury, please see his H&P for details  -this is an 85 year old male diagnosed with dementia few years ago, also has peripheral neuropathy, sleep apnea, dyslipidemia, glucose intolerance has had worsening cognition and mentation since last 7 to 10 days -In the emergency room he was noted to have a creatinine of 2.3, abnormal urinalysis suggestive of UTI, CT abdomen pelvis notable for recently passed kidney stones  1.  Toxic encephalopathy likely from UTI, passed kidney stones -Continue IV ceftriaxone today, follow-up urine cultures -Has baseline underlying dementia  2.  AKI suspected -No baseline labs in our system, wife unaware of any CKD at baseline -No history of NSAID use, no offending meds noted -Could have had recent hydronephrosis from stones, CT abdomen pelvis overnight notes recently passed kidney stones now -Continue IV fluids, monitor urine output -Requested records from Dr. Clent Jacks office to determine baseline creatinine  3.  Dementia -Per wife this was diagnosed few years ago, appears that his memory and cognitive deficits have been worsening recently -PT OT eval, needs supervision, advised wife not to let him drive any longer and reach out to family members for more help  Rest as noted by Dr. Maudie Mercury today  Domenic Polite, MD

## 2019-03-09 NOTE — H&P (Signed)
TRH H&P    Patient Demographics:    Mathew Ochoa, is a 85 y.o. male  MRN: [id number]  DOB - 01-Oct-1932  Admit Date - 03/08/2019  Referring MD/NP/PA: Charlann Lange  Outpatient Primary MD for the patient is Marius Ditch, MD  Patient coming from:  home  Chief complaint-   ams   HPI:    Mathew Ochoa  is a 85 y.o. male,  w ? Early dementia,  hyperlipidemia, glucose intolerance, peripheral neuropathy, OSA, presents with altered mental status over the past few days. Pt got lost driving 2 days ago in [place]. Pt thinks that he might have worms in his urine.  Pt denies fever, chills, cough, cp, palp, sob, n/v, diarrhea, dysuria.  ? Hematuria.  Pt has had less than normal oral intake.   In ED,  T 97.7, P 105, R 20, Bp 146/67  Pox 96% on RA  Wbc 9.0, Hgb 14.5, Plt 189 Na 141, K 4.0, Bun 52, Creatinine 2.32 Ast 43, Alt 33  Blood culture x2 Lactic acid 1.1  Urinalysis rbc >50, wbc 6-10    Pt will be admitted for AMS secondary to UTI, nephrolithiasis, and ARF   Review of systems:    In addition to the HPI above,  No Fever-chills, No Headache, No changes with Vision or hearing, No problems swallowing food or Liquids, No Chest pain, Cough or Shortness of Breath, No Abdominal pain, No Nausea or Vomiting, bowel movements are regular, No Blood in stool or Urine, No dysuria, No new skin rashes or bruises, No new joints pains-aches,  No new weakness, tingling, numbness in any extremity, No recent weight gain or loss, No polyuria, polydypsia or polyphagia, No significant Mental Stressors.  All other systems reviewed and are negative.    Past History of the following :    Past Medical History:  Diagnosis Date  . Adenomatous colon polyp   . Arthritis   . Concussion   . Constipation   . Demyelinating disorder ([place])    on ncv study  . Diplopia   . Facial palsy   . GERD (gastroesophageal  reflux disease)   . Glaucoma   . Impaired fasting glucose   . Lower back pain   . OSA (obstructive sleep apnea)   . Osteoarthritis of both hips   . Peripheral neuropathy   . Skin lesions       Past Surgical History:  Procedure Laterality Date  . ANKLE SURGERY    . APPENDECTOMY     85 yrs old  . APPENDECTOMY    . COLONOSCOPY WITH PROPOFOL  02/14/2012   Procedure: COLONOSCOPY WITH PROPOFOL;  Surgeon: Garlan Fair, MD;  Location: [facility];  Service: Endoscopy;  Laterality: N/A;  katrina/leone  . inguinal heniorrhaphy    . IRRIGATION AND DEBRIDEMENT SEBACEOUS CYST    . right leg surgery for fracture  yrs ago   pin later removed      Social History:      Social History   Tobacco Use  . Smoking status: Never  Smoker  . Smokeless tobacco: Never Used  Substance Use Topics  . Alcohol use: No       Family History :     Family History  Problem Relation Age of Onset  . Heart failure Mother   . Diabetes Father   . Heart disease Father   . Heart disease Brother   . Heart disease Brother   . Cancer - Other Daughter   . Cancer - Colon Brother   . Cancer - Prostate Brother        Home Medications:   Prior to Admission medications   Medication Sig Start Date End Date Taking? Authorizing Provider  acetaminophen (TYLENOL) 500 MG tablet Take 1,000 mg by mouth 2 (two) times daily.   Yes [provider]  aspirin EC 81 MG tablet Take 81 mg by mouth daily.    Yes [provider]  Brinzolamide-Brimonidine 1-0.2 % SUSP Place 1 drop into the right eye 3 (three) times daily. 06/13/18  Yes [provider]  Cholecalciferol (VITAMIN D3) 1000 UNITS CAPS Take 3,000 Units by mouth daily.    Yes [provider]  diphenhydrAMINE (BENADRYL) 25 MG tablet Take 25 mg by mouth at bedtime as needed for sleep.   Yes [provider]  latanoprost (XALATAN) 0.005 % ophthalmic solution Place 1 drop into the right eye at bedtime. 02/20/19  Yes  [provider]  Melatonin 5 MG TABS Take 5 mg by mouth at bedtime.   Yes [provider]  Omega-3 Fatty Acids (FISH OIL) 1000 MG CAPS Take 1,000 mg by mouth 2 (two) times daily.   Yes [provider]  omeprazole (PRILOSEC) 20 MG capsule Take 20 mg by mouth daily.    Yes [provider]  phenazopyridine (PYRIDIUM) 95 MG tablet Take 95 mg by mouth 3 (three) times daily as needed for pain.   Yes [provider]  simvastatin (ZOCOR) 40 MG tablet Take 40 mg by mouth at bedtime. 02/20/19  Yes [provider]  timolol (BETIMOL) 0.25 % ophthalmic solution Place 1 drop into the right eye daily.   Yes [provider]     Allergies:    No Known Allergies   Physical Exam:   Vitals  Blood pressure (!) 150/85, pulse 78, temperature 98.1 F (36.7 C), temperature source Oral, resp. rate (!) 26, SpO2 95 %.  1.  General: axoxo3  2. Psychiatric: euthymic  3. Neurologic: nonfocal  4. HEENMT:  Anicteric, pupils 1.36mm symmetric, direct, consensual , near intact Neck: no jvd  5. Respiratory : CTAB  6. Cardiovascular : rrr s1, s2, no m/g/r  7. Gastrointestinal:  ABd: soft, nt, nd, +bs  8. Skin:  Ext: no c/c/e,  No rash  9.Musculoskeletal:  Good ROM    Data Review:    CBC Recent Labs  Lab 03/08/19 1621  WBC 9.0  HGB 14.5  HCT 44.7  PLT 189  MCV 87.3  MCH 28.3  MCHC 32.4  RDW 13.5   ------------------------------------------------------------------------------------------------------------------  Results for orders placed or performed during the hospital encounter of 03/08/19 (from the past 48 hour(s))  Comprehensive metabolic panel     Status: Abnormal   Collection Time: 03/08/19  4:21 PM  Result Value Ref Range   Sodium 141 135 - 145 mmol/L   Potassium 4.0 3.5 - 5.1 mmol/L   Chloride 109 98 - 111 mmol/L   CO2 20 (L) 22 - 32 mmol/L   Glucose, Bld 163 (H) 70 - 99 mg/dL  BUN 52 (H) 8 - 23 mg/dL    Creatinine, Ser 2.32 (H) 0.61 - 1.24 mg/dL   Calcium 10.1 8.9 - 10.3 mg/dL   Total Protein 7.1 6.5 - 8.1 g/dL   Albumin 3.3 (L) 3.5 - 5.0 g/dL   AST 43 (H) 15 - 41 U/L   ALT 33 0 - 44 U/L   Alkaline Phosphatase 44 38 - 126 U/L   Total Bilirubin 1.1 0.3 - 1.2 mg/dL   GFR calc non Af Amer 25 (L) >60 mL/min   GFR calc Af Amer 29 (L) >60 mL/min   Anion gap 12 5 - 15    Comment: Performed at [facility] [address], [place] [address]  CBC     Status: None   Collection Time: 03/08/19  4:21 PM  Result Value Ref Range   WBC 9.0 4.0 - 10.5 K/uL   RBC 5.12 4.22 - 5.81 MIL/uL   Hemoglobin 14.5 13.0 - 17.0 g/dL   HCT 44.7 39.0 - 52.0 %   MCV 87.3 80.0 - 100.0 fL   MCH 28.3 26.0 - 34.0 pg   MCHC 32.4 30.0 - 36.0 g/dL   RDW 13.5 11.5 - 15.5 %   Platelets 189 150 - 400 K/uL   nRBC 0.0 0.0 - 0.2 %    Comment: Performed at [facility], [place] [address] [place], [place] [address]  Urinalysis, Routine w reflex microscopic     Status: Abnormal   Collection Time: 03/08/19  7:38 PM  Result Value Ref Range   Color, Urine AMBER (A) YELLOW    Comment: BIOCHEMICALS MAY BE AFFECTED BY COLOR   APPearance HAZY (A) CLEAR   Specific Gravity, Urine 1.028 1.005 - 1.030   pH 5.0 5.0 - 8.0   Glucose, UA NEGATIVE NEGATIVE mg/dL   Hgb urine dipstick LARGE (A) NEGATIVE   Bilirubin Urine NEGATIVE NEGATIVE   Ketones, ur NEGATIVE NEGATIVE mg/dL   Protein, ur 30 (A) NEGATIVE mg/dL   Nitrite POSITIVE (A) NEGATIVE   Leukocytes,Ua NEGATIVE NEGATIVE   RBC / HPF >50 (H) 0 - 5 RBC/hpf   WBC, UA 6-10 0 - 5 WBC/hpf   Bacteria, UA RARE (A) NONE SEEN   Squamous Epithelial / LPF 0-5 0 - 5   Mucus PRESENT     Comment: Performed at [facility], [address], [place], [place] [address]  Lactic acid, plasma     Status: None   Collection Time: 03/08/19 11:24 PM  Result Value Ref Range   Lactic Acid, Venous 1.4 0.5 - 1.9 mmol/L    Comment: Performed at [facility] [address] [place], [place] [address]  Lactic acid, plasma     Status: None   Collection Time: 03/09/19 12:28 AM  Result Value Ref Range   Lactic Acid, Venous 1.1 0.5 - 1.9 mmol/L    Comment: Performed at [facility] [address] [place], [place] [address]    Chemistries  Recent Labs  Lab 03/08/19 1621  NA 141  K 4.0  CL 109  CO2 20*  GLUCOSE 163*  BUN 52*  CREATININE 2.32*  CALCIUM 10.1  AST 43*  ALT 33  ALKPHOS 44  BILITOT 1.1   ------------------------------------------------------------------------------------------------------------------  ------------------------------------------------------------------------------------------------------------------ GFR: CrCl cannot be calculated (Unknown ideal weight.). Liver Function Tests: Recent Labs  Lab 03/08/19 1621  AST 43*  ALT 33  ALKPHOS 44  BILITOT 1.1  PROT 7.1  ALBUMIN 3.3*  No results for input(s): LIPASE, AMYLASE in the last 168 hours. No results for input(s): AMMONIA in the last 168 hours. Coagulation Profile: No results for input(s): INR, PROTIME in the last 168 hours. Cardiac Enzymes: No results for input(s): CKTOTAL, CKMB, CKMBINDEX, TROPONINI in the last 168 hours. BNP (last 3 results) No results for input(s): PROBNP in the last 8760 hours. HbA1C: No results for input(s): HGBA1C in the last 72 hours. CBG: No results for input(s): GLUCAP in the last 168 hours. Lipid Profile: No results for input(s): CHOL, HDL, LDLCALC, TRIG, CHOLHDL, LDLDIRECT in the last 72 hours. Thyroid Function Tests: No results for input(s): TSH, T4TOTAL, FREET4, T3FREE, THYROIDAB in the last 72 hours. Anemia Panel: No results for input(s): VITAMINB12, FOLATE, FERRITIN, TIBC, IRON, RETICCTPCT in the last 72 hours.  --------------------------------------------------------------------------------------------------------------- Urine analysis:    Component Value Date/Time   COLORURINE AMBER (A) 03/08/2019 1938    APPEARANCEUR HAZY (A) 03/08/2019 1938   LABSPEC 1.028 03/08/2019 1938   PHURINE 5.0 03/08/2019 1938   GLUCOSEU NEGATIVE 03/08/2019 1938   HGBUR LARGE (A) 03/08/2019 1938   BILIRUBINUR NEGATIVE 03/08/2019 [place] NEGATIVE 03/08/2019 1938   PROTEINUR 30 (A) 03/08/2019 1938   NITRITE POSITIVE (A) 03/08/2019 1938   LEUKOCYTESUR NEGATIVE 03/08/2019 1938      Imaging Results:    CT Head Wo Contrast  Result Date: 03/09/2019 CLINICAL DATA:  Confusion EXAM: CT HEAD WITHOUT CONTRAST TECHNIQUE: Contiguous axial images were obtained from the base of the skull through the vertex without intravenous contrast. COMPARISON:  None. FINDINGS: Brain: Mild atrophic changes and chronic white matter ischemic changes are seen. No findings to suggest acute hemorrhage, acute infarction or space-occupying mass lesion are noted. Vascular: No hyperdense vessel or unexpected calcification. Skull: Normal. Negative for fracture or focal lesion. Sinuses/Orbits: Postsurgical changes are noted in the left orbit. Other: None. IMPRESSION: Chronic atrophic and ischemic changes without acute abnormality. Electronically Signed   By: Inez Catalina M.D.   On: 03/09/2019 00:50   CT Renal Stone Study  Result Date: 03/09/2019 CLINICAL DATA:  Flank pain EXAM: CT ABDOMEN AND PELVIS WITHOUT CONTRAST TECHNIQUE: Multidetector CT imaging of the abdomen and pelvis was performed following the standard protocol without IV contrast. COMPARISON:  None. FINDINGS: Lower chest: No acute abnormality. Hepatobiliary: Scattered cysts are noted within the liver. The gallbladder is within normal limits. Pancreas: Unremarkable. No pancreatic ductal dilatation or surrounding inflammatory changes. Spleen: Normal in size without focal abnormality. Adrenals/Urinary Tract: Adrenal glands are within normal limits. Kidneys are well visualized bilaterally. Large right renal cyst is noted measuring a within the 8.2 cm. Left kidney is within normal limits.  Right kidney demonstrates some nonobstructing lower pole renal stones. Hydronephrosis and hydroureter is seen which extends to the level of the urinary bladder. A small 2-3 mm stone is noted in the dependent portion of the bladder consistent with a recently passed stone. Stomach/Bowel: Diverticular change of the colon is noted. The appendix has been surgically removed. No obstructive or inflammatory changes of large or small bowel is noted. Vascular/Lymphatic: Aortic atherosclerosis. No enlarged abdominal or pelvic lymph nodes. Reproductive: Prostate is enlarged. Other: No abdominal wall hernia or abnormality. No abdominopelvic ascites. Musculoskeletal: Degenerative changes of lumbar spine are noted. IMPRESSION: Recently passed stone within the urinary bladder measuring 2-3 mm. Mild residual hydronephrosis and hydroureter is noted related to distal edema. Tiny nonobstructing right renal calculi. Hepatic and renal cysts. Electronically Signed   By: Inez Catalina M.D.   On: 03/09/2019 00:54  Assessment & Plan:    Principal Problem:   ARF (acute renal failure) (HCC) Active Problems:   Acute lower UTI  ARF Hydrate with ns iv Check cmp in am  AMS secondary to UTI on ? Early dementia Monitor  Acute lower uti Urine culture  Rocephin 1gm iv qday  ? Parasites in urine? Check cbc with diff,  Look for eosinophilia  Gerd Cont PPI  Hyperlipidemia Cont Simvastatin 40mg  po qhs  Glaucoma  Cont Xalatan Cont Timolol Cont Brinazolamide-Brimonidine    DVT Prophylaxis-   Lovenox - SCDs   AM Labs Ordered, also please review Full Orders  Family Communication: Admission, patients condition and plan of care including tests being ordered have been discussed with the patient who indicate understanding and agree with the plan and Code Status.  Code Status:  FULL CODE per patient, wife present with patient in ED  Admission status: Observation: Based on patients clinical presentation and  evaluation of above clinical data, I have made determination that patient meets observation criteria at this time.   Time spent in minutes : 55 minutes   Jani Gravel M.D on 03/09/2019 at 2:03 AM

## 2019-03-09 NOTE — Evaluation (Signed)
Physical Therapy Evaluation Patient Details Name: Mathew Ochoa MRN: [id number] DOB: Jan 06, 1933 Today's Date: 03/09/2019   History of Present Illness  85 yo male with onset of AMS, recent issues walking from home and getting lost, was admitted.  Has acute renal failure, Kidney stone passed, UTI, has clear head CT.  Covid (-).  PMHx:  dementia, OSA, HLD, PN, facial nerve palsy, diplopia, B hip OA, glaucoma, concussion,    Clinical Impression  Pt was seen for mobility check, and talked with him about his previous walking ability.  Will need to be seen with HHPT for follow up of gait and balance, and to manage fall risk.  Follow up with him for acute therapy and will expect to progress him with endurance, balance and control of his use of any AD.  Pt is requiring contact support with hand on IV pole, and so may need to use AD indoors at home.    Follow Up Recommendations Home health PT;Supervision for mobility/OOB    Equipment Recommendations  None recommended by PT(has a rollator and SPC)    Recommendations for Other Services       Precautions / Restrictions Precautions Precautions: Fall Precaution Comments: glaucoma Restrictions Weight Bearing Restrictions: No      Mobility  Bed Mobility Overal bed mobility: Needs Assistance Bed Mobility: Supine to Sit;Sit to Supine     Supine to sit: Min assist Sit to supine: Min assist   General bed mobility comments: min assist for supporting the effort but pt can perform with minor assist  Transfers Overall transfer level: Needs assistance Equipment used: 1 person hand held assist Transfers: Sit to/from Stand Sit to Stand: Min guard         General transfer comment: min guard for steadying and then used IV pole for contact support  Ambulation/Gait Ambulation/Gait assistance: Min guard Gait Distance (Feet): 100 Feet Assistive device: 1 person hand held assist;IV Pole Gait Pattern/deviations: Step-through pattern;Decreased  stride length;Wide base of support;Trunk flexed Gait velocity: reduced Gait velocity interpretation: <1.31 ft/sec, indicative of household ambulator General Gait Details: slow pace with wide turns, using IV pole for contact support  Stairs            Wheelchair Mobility    Modified Rankin (Stroke Patients Only)       Balance Overall balance assessment: Needs assistance Sitting-balance support: Feet supported Sitting balance-Leahy Scale: Fair     Standing balance support: Single extremity supported Standing balance-Leahy Scale: Fair Standing balance comment: fair static and dynamic fair-                             Pertinent Vitals/Pain Pain Assessment: No/denies pain    Home Living Family/patient expects to be discharged to:: Private residence Living Arrangements: Spouse/significant other Available Help at Discharge: Family;Available 24 hours/day Type of Home: House Home Access: Level entry     Home Layout: One level Home Equipment: Cane - single point Additional Comments: pt has some confusion but is clear about his PLOF    Prior Function Level of Independence: Independent with assistive device(s);Needs assistance   Gait / Transfers Assistance Needed: SPC outside, no AD inside  ADL's / Homemaking Assistance Needed: wife helps with cooking and cleaning but he can bathe and dress  Comments: pt is answering questions but may be limited history due to AMS     Hand Dominance   Dominant Hand: Right    Extremity/Trunk Assessment   Upper  Extremity Assessment Upper Extremity Assessment: Overall WFL for tasks assessed    Lower Extremity Assessment Lower Extremity Assessment: Overall WFL for tasks assessed    Cervical / Trunk Assessment Cervical / Trunk Assessment: Kyphotic  Communication   Communication: No difficulties  Cognition Arousal/Alertness: Awake/alert Behavior During Therapy: Flat affect Overall Cognitive Status:  Impaired/Different from baseline Area of Impairment: Awareness;Safety/judgement;Following commands                       Following Commands: Follows one step commands inconsistently;Follows one step commands with increased time Safety/Judgement: Decreased awareness of safety;Decreased awareness of deficits Awareness: Intellectual          General Comments General comments (skin integrity, edema, etc.): Pt is home with wife who per pt does not use AD.  Pt is minimally needing support, and would benefit from HHPT to follow him due to his lower endurance, but not essential    Exercises     Assessment/Plan    PT Assessment Patient needs continued PT services  PT Problem List Decreased range of motion;Decreased activity tolerance;Decreased balance;Decreased mobility;Decreased cognition       PT Treatment Interventions DME instruction;Gait training;Functional mobility training;Therapeutic activities;Therapeutic exercise;Balance training;Neuromuscular re-education;Patient/family education    PT Goals (Current goals can be found in the Care Plan section)  Acute Rehab PT Goals Patient Stated Goal: to get home with wife PT Goal Formulation: With patient Time For Goal Achievement: 03/23/19 Potential to Achieve Goals: Good    Frequency Min 3X/week   Barriers to discharge Decreased caregiver support home with wife    Co-evaluation               AM-PAC PT "6 Clicks" Mobility  Outcome Measure Help needed turning from your back to your side while in a flat bed without using bedrails?: A Little Help needed moving from lying on your back to sitting on the side of a flat bed without using bedrails?: A Little Help needed moving to and from a bed to a chair (including a wheelchair)?: A Little Help needed standing up from a chair using your arms (e.g., wheelchair or bedside chair)?: A Little Help needed to walk in hospital room?: A Little Help needed climbing 3-5 steps with a  railing? : A Lot 6 Click Score: 17    End of Session Equipment Utilized During Treatment: Gait belt Activity Tolerance: Patient tolerated treatment well;Patient limited by fatigue Patient left: in bed;with call bell/phone within reach;with bed alarm set Nurse Communication: Mobility status PT Visit Diagnosis: Unsteadiness on feet (R26.81);Difficulty in walking, not elsewhere classified (R26.2)    Time: [id number] PT Time Calculation (min) (ACUTE ONLY): 28 min   Charges:   PT Evaluation $PT Eval Moderate Complexity: 1 Mod PT Treatments $Gait Training: 8-22 mins       Ramond Dial 03/09/2019, 1:21 PM   Mee Hives, PT MS Acute Rehab Dept. Number: [place] [facility] [place]

## 2019-03-09 NOTE — ED Notes (Signed)
Spoke with [facility] about pt's wife staying with him. Due to pt's recent periods of confusion and agitation, and due to getting lost yesterday, the [facility] granted pt's wife access to stay with him. Floor informed

## 2019-03-10 DIAGNOSIS — G9341 Metabolic encephalopathy: Secondary | ICD-10-CM

## 2019-03-10 LAB — URINE CULTURE: Culture: NO GROWTH

## 2019-03-10 LAB — BASIC METABOLIC PANEL
Anion gap: 9 (ref 5–15)
BUN: 36 mg/dL — ABNORMAL HIGH (ref 8–23)
CO2: 23 mmol/L (ref 22–32)
Calcium: 9.3 mg/dL (ref 8.9–10.3)
Chloride: 113 mmol/L — ABNORMAL HIGH (ref 98–111)
Creatinine, Ser: 1.71 mg/dL — ABNORMAL HIGH (ref 0.61–1.24)
GFR calc Af Amer: 42 mL/min — ABNORMAL LOW (ref >60)
GFR calc non Af Amer: 36 mL/min — ABNORMAL LOW (ref >60)
Glucose, Bld: 112 mg/dL — ABNORMAL HIGH (ref 70–99)
Potassium: 4.2 mmol/L (ref 3.5–5.1)
Sodium: 145 mmol/L (ref 135–145)

## 2019-03-10 LAB — CBC
HCT: 39.1 % (ref 39.0–52.0)
Hemoglobin: 12.5 g/dL — ABNORMAL LOW (ref 13.0–17.0)
MCH: 28.2 pg (ref 26.0–34.0)
MCHC: 32 g/dL (ref 30.0–36.0)
MCV: 88.1 fL (ref 80.0–100.0)
Platelets: 179 10*3/uL (ref 150–400)
RBC: 4.44 MIL/uL (ref 4.22–5.81)
RDW: 13.4 % (ref 11.5–15.5)
WBC: 6.1 10*3/uL (ref 4.0–10.5)
nRBC: 0 % (ref 0.0–0.2)

## 2019-03-10 MED ORDER — SENNOSIDES-DOCUSATE SODIUM 8.6-50 MG PO TABS
1.0000 | ORAL_TABLET | Freq: Two times a day (BID) | ORAL | Status: DC
  Administered 2019-03-10 – 2019-03-11 (×2): 1 via ORAL
  Filled 2019-03-10 (×2): qty 1

## 2019-03-10 MED ORDER — SIMVASTATIN 20 MG PO TABS
40.0000 mg | ORAL_TABLET | Freq: Every day | ORAL | Status: DC
  Administered 2019-03-10: 40 mg via ORAL
  Filled 2019-03-10: qty 2

## 2019-03-10 MED ORDER — LACTATED RINGERS IV SOLN
INTRAVENOUS | Status: DC
  Administered 2019-03-10: 75 mL/h via INTRAVENOUS

## 2019-03-10 MED ORDER — ASPIRIN EC 81 MG PO TBEC
81.0000 mg | DELAYED_RELEASE_TABLET | Freq: Every day | ORAL | Status: DC
  Administered 2019-03-10 – 2019-03-11 (×2): 81 mg via ORAL
  Filled 2019-03-10 (×2): qty 1

## 2019-03-10 MED ORDER — HYDRALAZINE HCL 10 MG PO TABS: 10.0000 mg | ORAL_TABLET | Freq: Three times a day (TID) | ORAL | Status: DC | PRN

## 2019-03-10 MED ORDER — MELATONIN 3 MG PO TABS
4.5000 mg | ORAL_TABLET | Freq: Every day | ORAL | Status: DC
  Administered 2019-03-10: 4.5 mg via ORAL
  Filled 2019-03-10: qty 1.5

## 2019-03-10 NOTE — Evaluation (Signed)
Occupational Therapy Evaluation Patient Details Name: XZAVION CACKOWSKI MRN: [id number] DOB: 1932/04/17 Today's Date: 03/10/2019    History of Present Illness 85 yo male with onset of AMS, recent issues walking from home and getting lost, was admitted.  Has acute renal failure, Kidney stone passed, UTI, has clear head CT.  Covid (-).  PMHx:  dementia, OSA, HLD, PN, facial nerve palsy, diplopia, B hip OA, glaucoma, concussion,     Clinical Impression   PTA pt living at home with wife, and is poor historian considering his cognitive deficits. Information obtained per RN who spoke with spouse. Spouse states that pt has been incontinent of urine throughout the house, difficulty managing environment and becoming safety risk. He has gotten lost while driving. Pt reports MD telling him he cannot drive today, and has decreased awareness as to why. Attempted education, but pt still not fully grasping concept. Pt is able to complete bed mobility and transfers at min guard level. Pt completed functional mobility in the hall at min guard for trail making. He needed max VC's to find his room, even though he was confident and insistent he could find it. Given current pt status, recommend HHOT for home modifications and safety to assist in cognitive decline. Will continue to follow per POC Listed below.     Follow Up Recommendations  Home health OT;Supervision/Assistance - 24 hour    Equipment Recommendations  None recommended by OT    Recommendations for Other Services       Precautions / Restrictions Precautions Precautions: Fall Precaution Comments: glaucoma Restrictions Weight Bearing Restrictions: No      Mobility Bed Mobility Overal bed mobility: Needs Assistance Bed Mobility: Supine to Sit;Sit to Supine     Supine to sit: Min guard Sit to supine: Min guard   General bed mobility comments: min guard for increased time and effort, insistent that OT does not assist  Transfers Overall  transfer level: Needs assistance Equipment used: None Transfers: Sit to/from Stand Sit to Stand: Min guard         General transfer comment: min guard for safety    Balance Overall balance assessment: Needs assistance Sitting-balance support: Feet supported Sitting balance-Leahy Scale: Good     Standing balance support: No upper extremity supported;During functional activity Standing balance-Leahy Scale: Fair                             ADL either performed or assessed with clinical judgement   ADL Overall ADL's : Needs assistance/impaired Eating/Feeding: Set up;Sitting   Grooming: Supervision/safety;Standing   Upper Body Bathing: Supervision/ safety;Sitting;Standing   Lower Body Bathing: Supervison/ safety;Sit to/from stand;Sitting/lateral leans   Upper Body Dressing : Supervision/safety;Sitting;Standing   Lower Body Dressing: Supervision/safety;Sit to/from stand;Sitting/lateral leans   Toilet Transfer: Supervision/safety;Ambulation;Regular Toilet   Toileting- Water quality scientist and Hygiene: Supervision/safety;Sit to/from stand Toileting - Clothing Manipulation Details (indicate cue type and reason): wife reports incontinence due to increased confusion with difficulty navigating home environment Tub/ Shower Transfer: Supervision/safety;Ambulation;Shower seat   Functional mobility during ADLs: Supervision/safety;Cueing for safety;Cueing for sequencing General ADL Comments: pt is physically able to complete BADL, but requires max safety cues and supervision. He is largely unaware of deficits     Vision Baseline Vision/History: Glaucoma Patient Visual Report: No change from baseline Additional Comments: pt has hx of glaucoma and some diplopia, reports not deficits at eval and functionally seemed at baseline     Perception  Praxis      Pertinent Vitals/Pain Pain Assessment: No/denies pain     Hand Dominance     Extremity/Trunk Assessment  Upper Extremity Assessment Upper Extremity Assessment: Overall WFL for tasks assessed   Lower Extremity Assessment Lower Extremity Assessment: Defer to PT evaluation       Communication Communication Communication: No difficulties   Cognition Arousal/Alertness: Awake/alert Behavior During Therapy: WFL for tasks assessed/performed Overall Cognitive Status: History of cognitive impairments - at baseline(has been worsening lately per spouse) Area of Impairment: Orientation;Memory;Safety/judgement;Awareness;Problem solving                 Orientation Level: Disoriented to;Situation   Memory: Decreased short-term memory   Safety/Judgement: Decreased awareness of safety;Decreased awareness of deficits Awareness: Intellectual Problem Solving: Requires verbal cues General Comments: pt largely unaware of deficits and safety conerns. States doctor told him not to drive today, and cannot conceptualize why. Pt needed max VC's to complete trail making task in hallway to find way back to room   General Comments       Exercises     Shoulder Instructions      Home Living Family/patient expects to be discharged to:: Private residence Living Arrangements: Spouse/significant other Available Help at Discharge: Family;Available 24 hours/day Type of Home: House Home Access: Level entry     Home Layout: One level     Bathroom Shower/Tub: Teacher, early years/pre: Standard     Home Equipment: Cane - single point          Prior Functioning/Environment Level of Independence: Independent with assistive device(s);Needs assistance  Gait / Transfers Assistance Needed: SPC outside, no AD inside ADL's / Homemaking Assistance Needed: pt independent in BADL, wife assists in IADLs. Wife does report recent incontinence episodes with pt urinating around home            OT Problem List: Decreased knowledge of precautions;Decreased cognition;Decreased safety awareness;Impaired  balance (sitting and/or standing)      OT Treatment/Interventions: Self-care/ADL training;Therapeutic exercise;Patient/family education;Balance training;Therapeutic activities;DME and/or AE instruction;Cognitive remediation/compensation    OT Goals(Current goals can be found in the care plan section) Acute Rehab OT Goals Patient Stated Goal: to get home with wife OT Goal Formulation: With patient Time For Goal Achievement: 03/24/19 Potential to Achieve Goals: Good  OT Frequency: Min 2X/week   Barriers to D/C:            Co-evaluation              AM-PAC OT "6 Clicks" Daily Activity     Outcome Measure Help from another person eating meals?: A Little Help from another person taking care of personal grooming?: A Little Help from another person toileting, which includes using toliet, bedpan, or urinal?: A Little Help from another person bathing (including washing, rinsing, drying)?: A Little Help from another person to put on and taking off regular upper body clothing?: A Little Help from another person to put on and taking off regular lower body clothing?: A Little 6 Click Score: 18   End of Session Equipment Utilized During Treatment: Gait belt Nurse Communication: Mobility status  Activity Tolerance: Patient tolerated treatment well Patient left: in bed;with call bell/phone within reach;with bed alarm set  OT Visit Diagnosis: Unsteadiness on feet (R26.81);Other symptoms and signs involving cognitive function                Time: [id number] OT Time Calculation (min): 16 min Charges:  OT General Charges $OT Visit: 1 Visit OT  Evaluation $OT Eval Low Complexity: 1 Low  Zenovia Jarred, MSOT, OTR/L Behavioral Health OT/ Acute Relief OT [facility] Office: [place] 03/10/2019, 4:52 PM

## 2019-03-10 NOTE — Progress Notes (Signed)
Pt just lost IV access. Pt is supposed to discharge home tomorrow. RN messaged MD on call to see if pt needs another IV for LR at 75, or if it is okay to stop the fluids. Awaiting response.   Eleanora Neighbor, RN

## 2019-03-10 NOTE — Progress Notes (Signed)
PROGRESS NOTE  Mathew Ochoa [id number] DOB: November 17, 1932 DOA: 03/08/2019 PCP: Mathew Ditch, MD   Brief history: Mathew Ochoa  is a 85 y.o. male,  w ? Early dementia,  hyperlipidemia, glucose intolerance, peripheral neuropathy, OSA, presents with altered mental status over the past few days. Pt got lost driving 2 days ago in [place]. Pt thinks that he might have worms in his urine.  Pt denies fever, chills, cough, cp, palp, sob, n/v, diarrhea, dysuria.  ? Hematuria.  Pt has had less than normal oral intake.   In ED,  T 97.7, P 105, R 20, Bp 146/67  Pox 96% on RA  Wbc 9.0, Hgb 14.5, Plt 189 Na 141, K 4.0, Bun 52, Creatinine 2.32 Ast 43, Alt 33  Blood culture x2 Lactic acid 1.1  Urinalysis rbc >50, wbc 6-10    Pt will be admitted for AMS secondary to UTI, nephrolithiasis, and ARF    HPI/Recap of past 24 hours:  Cr improving, urine culture in process Patient reports right flank pain has resolved, no fever Wife in room , she is concerned that patient has been passing "worms" in his urine in the last few days  Assessment/Plan: Principal Problem:   ARF (acute renal failure) ([place]w [place]cho[place]) Active Problems:   Acute lower UTI  Acute metabolic encephalopathy in the setting of dehydration, acute renal failure, and past kidney stone -He has improved, now back to baseline dementia, DC telemetry  Acute renal failure -Unknown baseline -BUN 52 creatinine 2.3 on presentation,  -UA concerns for UTI, urine culture no growth, blood culture no growth, there is no fever no leukocytosis ,  received total 2 doses of Rocephin, will discontinue antibiotic for now -Renal function improving on hydration, will continue hydration for another 24 hours, repeat BMP, renal dosing meds  Nephrolithiasis -CT renal stone protocol showed " teeny nonobstructing right renal calculi, recently passed stone within the urinary bladder measuring 2 to 3 mm, mild residual hydronephrosis and hydroureter is  noted related to distal edema; -Patient symptom has resolved -Case discussed with urology Dr. Alinda Money who recommend medical management, avoid dehydration, outpatient urology follow-up as needed  Dementia -Patient is advised not to drive -Follow-up with neurology  DVT Prophylaxis:lovenox  Code Status: full  Family Communication: patient , wife and son  Disposition Plan: home with home health pending  cr improvement, likely tomorrow   Consultants:  Phone conversation with urology Dr Alinda Money   Procedures:  None  Antibiotics:  Rocephin x2 doses   Objective: BP (!) 114/58 (BP Location: Right Arm)    Pulse 76    Temp 98.7 F (37.1 C) (Oral)    Resp 16    Wt 95 kg    SpO2 93%    BMI 31.84 kg/m   Intake/Output Summary (Last 24 hours) at 03/10/2019 [id number] Last data filed at 03/10/2019 0600 Gross per 24 hour  Intake 1852.4 ml  Output 0 ml  Net 1852.4 ml   Filed Weights   03/10/19 0653  Weight: 95 kg    Exam: Patient is examined daily including today on 03/10/2019, exams remain the same as of yesterday except that has changed    General:  NAD, ambulating in room  Cardiovascular: RRR  Respiratory: CTABL  Abdomen: Soft/ND/NT, positive BS  Musculoskeletal: No Edema  Neuro: alert, oriented , memory impairment  Data Reviewed: Basic Metabolic Panel: Recent Labs  Lab 03/08/19 1621 03/09/19 0501 03/10/19 0438  NA 141 145 145  K 4.0 4.4 4.2  CL 109  109 113*  CO2 20* 23 23  GLUCOSE 163* 113* 112*  BUN 52* 52* 36*  CREATININE 2.32* 2.32* 1.71*  CALCIUM 10.1 9.9 9.3   Liver Function Tests: Recent Labs  Lab 03/08/19 1621 03/09/19 0501  AST 43* 33  ALT 33 31  ALKPHOS 44 34*  BILITOT 1.1 0.9  PROT 7.1 6.6  ALBUMIN 3.3* 2.9*   No results for input(s): LIPASE, AMYLASE in the last 168 hours. No results for input(s): AMMONIA in the last 168 hours. CBC: Recent Labs  Lab 03/08/19 1621 03/09/19 0501 03/10/19 0438  WBC 9.0 8.6 6.1  NEUTROABS  --  5.4  --    HGB 14.5 13.6 12.5*  HCT 44.7 42.0 39.1  MCV 87.3 87.3 88.1  PLT 189 174 179   Cardiac Enzymes:   No results for input(s): CKTOTAL, CKMB, CKMBINDEX, TROPONINI in the last 168 hours. BNP (last 3 results) No results for input(s): BNP in the last 8760 hours.  ProBNP (last 3 results) No results for input(s): PROBNP in the last 8760 hours.  CBG: No results for input(s): GLUCAP in the last 168 hours.  Recent Results (from the past 240 hour(s))  Culture, blood (routine x 2)     Status: None (Preliminary result)   Collection Time: 03/08/19 11:22 PM   Specimen: BLOOD LEFT HAND  Result Value Ref Range Status   Specimen Description BLOOD LEFT HAND  Final   Special Requests   Final    BOTTLES DRAWN AEROBIC AND ANAEROBIC Blood Culture results may not be optimal due to an inadequate volume of blood received in culture bottles   Culture   Final    NO GROWTH 1 DAY Performed at [facility], [place] [address], [place], [place] [address]    Report Status PENDING  Incomplete  SARS CORONAVIRUS 2 (TAT 6-24 HRS) Nasopharyngeal Nasopharyngeal Swab     Status: None   Collection Time: 03/08/19 11:45 PM   Specimen: Nasopharyngeal Swab  Result Value Ref Range Status   SARS Coronavirus 2 NEGATIVE NEGATIVE Final    Comment: (NOTE) SARS-CoV-2 target nucleic acids are NOT DETECTED. The SARS-CoV-2 RNA is generally detectable in upper and lower respiratory specimens during the acute phase of infection. Negative results do not preclude SARS-CoV-2 infection, do not rule out co-infections with other pathogens, and should not be used as the sole basis for treatment or other patient management decisions. Negative results must be combined with clinical observations, patient history, and epidemiological information. The expected result is Negative. Fact Sheet for Patients: SugarRoll.be Fact Sheet for Healthcare Providers: https://www.woods-mathews.com/ This test  is not yet approved or cleared by the [place] FDA and  has been authorized for detection and/or diagnosis of SARS-CoV-2 by FDA under an Emergency Use Authorization (EUA). This EUA will remain  in effect (meaning this test can be used) for the duration of the COVID-19 declaration under Section 56 4(b)(1) of the Act, 21 U.S.C. section 360bbb-3(b)(1), unless the authorization is terminated or revoked sooner. Performed at [facility], [place] [address], [place], [place] [address]   Culture, blood (routine x 2)     Status: None (Preliminary result)   Collection Time: 03/08/19 11:50 PM   Specimen: BLOOD  Result Value Ref Range Status   Specimen Description BLOOD RIGHT ANTECUBITAL  Final   Special Requests   Final    BOTTLES DRAWN AEROBIC AND ANAEROBIC Blood Culture results may not be optimal due to an excessive volume of blood received in culture  bottles   Culture   Final    NO GROWTH 1 DAY Performed at [facility], [place] [address] [place], [place] [address]    Report Status PENDING  Incomplete     Studies: No results found.  Scheduled Meds:  amLODipine  10 mg Oral Daily   enoxaparin (LOVENOX) injection  30 mg Subcutaneous Q24H   pantoprazole  40 mg Oral Daily   sodium chloride flush  3 mL Intravenous Once    Continuous Infusions:  cefTRIAXone (ROCEPHIN)  IV 1 g (03/09/19 2145)     Time spent: 71mins I have personally reviewed and interpreted on  03/10/2019 daily labs, tele strips, imagings as discussed above under date review session and assessment and plans.  I reviewed all nursing notes, pharmacy notes,   vitals, pertinent old records  I have discussed plan of care as described above with RN , patient and family on 03/10/2019   Florencia Reasons MD, PhD, FACP  [facility] Pager [phone]. If 7PM-7AM, please contact night-coverage at [email or web address], password [facility] 03/10/2019, 8:37 AM  LOS: 1 day

## 2019-03-10 NOTE — Progress Notes (Signed)
MD wanted another IV to continue LR due to creatinine.   Eleanora Neighbor, RN

## 2019-03-11 DIAGNOSIS — G9341 Metabolic encephalopathy: Secondary | ICD-10-CM | POA: Diagnosis present

## 2019-03-11 LAB — BASIC METABOLIC PANEL
Anion gap: 10 (ref 5–15)
BUN: 30 mg/dL — ABNORMAL HIGH (ref 8–23)
CO2: 23 mmol/L (ref 22–32)
Calcium: 9.7 mg/dL (ref 8.9–10.3)
Chloride: 111 mmol/L (ref 98–111)
Creatinine, Ser: 1.23 mg/dL (ref 0.61–1.24)
GFR calc Af Amer: >60 mL/min (ref >60)
GFR calc non Af Amer: 54 mL/min — ABNORMAL LOW (ref >60)
Glucose, Bld: 107 mg/dL — ABNORMAL HIGH (ref 70–99)
Potassium: 4.2 mmol/L (ref 3.5–5.1)
Sodium: 144 mmol/L (ref 135–145)

## 2019-03-11 MED ORDER — CEPHALEXIN 500 MG PO CAPS: 500.0000 mg | ORAL_CAPSULE | Freq: Three times a day (TID) | ORAL | 0 refills | Status: AC

## 2019-03-11 NOTE — Progress Notes (Signed)
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 Discussed prescriptions and follow up appointments with the patient. Prescriptions given to patient; medication list explained in detail. Patient verbalized understanding.  Skin clean, dry and intact without evidence of skin break down, no evidence of skin tears noted. IV catheter discontinued intact. Site without signs and symptoms of complications. Dressing and pressure applied. Pt denies pain at the site currently. No complaints noted.  Patient free of lines, drains, and wounds.   An After Visit Summary (AVS) was printed and given to the patient. Patient escorted via wheelchair, and discharged home via private auto.  Aneta Mins BSN, RN3

## 2019-03-11 NOTE — Progress Notes (Signed)
PT Cancellation Note  Patient Details Name: Mathew Ochoa MRN: [id number] DOB: 31-Dec-1932   Cancelled Treatment:    Reason Eval/Treat Not Completed: Patient declined. Pt scheduled to d/c today and wife politely declined PT session. Will check back.   Galen Manila 03/11/2019, 10:19 AM

## 2019-03-11 NOTE — Discharge Summary (Signed)
Physician Discharge Summary  Mathew Ochoa [id number] DOB: 1933-01-28 DOA: 03/08/2019  PCP: Mathew Ditch, MD  Admit date: 03/08/2019 Discharge date: 03/11/2019  Admitted From: Home Disposition: Home  Recommendations for Outpatient Follow-up:  1. Follow up with PCP in 1-2 weeks 2. Please obtain BMP/CBC in one week 3. Please follow up on the following pending results:  Home Health: Yes Equipment/Devices: None  Discharge Condition: Stable CODE STATUS: Full code Diet recommendation: Cardiac  Subjective: Seen and examined.  No complaints.  Wife at the bedside.  It takes time for him to answer the question.  Per wife, he has improved significantly and this is his baseline.  Brief/Interim Summary: Mathew Ochoa a86 y.o.male,w ? Early dementia, hyperlipidemia, glucose intolerance, peripheral neuropathy, OSA, presents with altered mental status over the past few days. Pt got lost driving 2 days ago in [place]. Pt thinks that he might have worms in his urine. Pt denies fever, chills, cough, cp, palp, sob, n/v, diarrhea, dysuria. ? Hematuria. Pt has had less than normal oral intake. In ED, His vitals were T 97.7, P 105, R 20, Bp 146/67 Pox 96% on RA.  Normal white cells.  Normal lactic acid.  UA showed positive WBC, nitrites and leukoesterase.  He was initially diagnosed with acute metabolic encephalopathy secondary to possible UTI and started on Rocephin.  Over the course of last 2 days, patient's mental status has improved significantly and per his wife who is at the bedside, currently patient is at his baseline mental status based on his known dementia.  He was seen by PT OT and they recommended home health which has been arranged for him.  He has received 2 days of IV Rocephin however his Rocephin was discontinued yesterday by the hospitalist who had seen him.  I personally believe that he probably has UTI which caused acute metabolic encephalopathy as his urine clearly is  showing nitrites, leukoesterase, white blood cells and he is a male so I would lean towards treating this and thus I am going to send him on 5 more days of oral Keflex.  He will follow with PCP.  He is being discharged in stable condition. Of note he also had CT renal stone which showed nonobstructing right renal calculi, recently passed a stone within the urinary bladder measuring 2 to 3 mm, mild residual hydronephrosis and hydroureter is noted related to distal edema.  Based on this, he might have had passed kidney stone.  Previous hospitalist had discussed the case with on-call urologist who had recommended medical management and hydration and urology follow-up as needed.  Discharge Diagnoses:  Principal Problem:   ARF (acute renal failure) (HCC) Active Problems:   Acute lower UTI   Acute metabolic encephalopathy    Discharge Instructions  Discharge Instructions    Discharge patient   Complete by: As directed    Discharge disposition: 06-Home-Health Care Svc   Discharge patient date: 03/11/2019     Allergies as of 03/11/2019   No Known Allergies     Medication List    TAKE these medications   acetaminophen 500 MG tablet Commonly known as: TYLENOL Take 1,000 mg by mouth 2 (two) times daily.   aspirin EC 81 MG tablet Take 81 mg by mouth daily.   cephALEXin 500 MG capsule Commonly known as: KEFLEX Take 1 capsule (500 mg total) by mouth 3 (three) times daily for 5 days.   diphenhydrAMINE 25 MG tablet Commonly known as: BENADRYL Take 50 mg by mouth at bedtime.  Fish Oil 1000 MG Caps Take 1,000 mg by mouth 2 (two) times daily.   latanoprost 0.005 % ophthalmic solution Commonly known as: XALATAN Place 1 drop into the right eye at bedtime.   Melatonin 5 MG Tabs Take 5 mg by mouth at bedtime.   phenazopyridine 95 MG tablet Commonly known as: PYRIDIUM Take 95 mg by mouth 3 (three) times daily as needed for pain.   PriLOSEC OTC 20 MG tablet Generic drug:  omeprazole Take 20 mg by mouth daily before breakfast.   Rocklatan 0.02-0.005 % Soln Generic drug: Netarsudil-Latanoprost Place 1 drop into both eyes at bedtime.   simvastatin 40 MG tablet Commonly known as: ZOCOR Take 40 mg by mouth at bedtime.   timolol 0.5 % ophthalmic solution Commonly known as: TIMOPTIC Place 1 drop into the right eye daily.   Vitamin D3 25 MCG (1000 UT) Caps Take 3,000 Units by mouth daily.      Follow-up Information    Mathew Ditch, MD Follow up in 1 week(s).   Specialty: Internal Medicine Why: hospital discharge followu p, repeat cbc/bmp at follow up. Contact information: [address] [place] [address] [phone]          No Known Allergies  Consultations: None   Procedures/Studies: CT Head Wo Contrast  Result Date: 03/09/2019 CLINICAL DATA:  Confusion EXAM: CT HEAD WITHOUT CONTRAST TECHNIQUE: Contiguous axial images were obtained from the base of the skull through the vertex without intravenous contrast. COMPARISON:  None. FINDINGS: Brain: Mild atrophic changes and chronic white matter ischemic changes are seen. No findings to suggest acute hemorrhage, acute infarction or space-occupying mass lesion are noted. Vascular: No hyperdense vessel or unexpected calcification. Skull: Normal. Negative for fracture or focal lesion. Sinuses/Orbits: Postsurgical changes are noted in the left orbit. Other: None. IMPRESSION: Chronic atrophic and ischemic changes without acute abnormality. Electronically Signed   By: Inez Catalina M.D.   On: 03/09/2019 00:50   CT Renal Stone Study  Result Date: 03/09/2019 CLINICAL DATA:  Flank pain EXAM: CT ABDOMEN AND PELVIS WITHOUT CONTRAST TECHNIQUE: Multidetector CT imaging of the abdomen and pelvis was performed following the standard protocol without IV contrast. COMPARISON:  None. FINDINGS: Lower chest: No acute abnormality. Hepatobiliary: Scattered cysts are noted within the liver. The  gallbladder is within normal limits. Pancreas: Unremarkable. No pancreatic ductal dilatation or surrounding inflammatory changes. Spleen: Normal in size without focal abnormality. Adrenals/Urinary Tract: Adrenal glands are within normal limits. Kidneys are well visualized bilaterally. Large right renal cyst is noted measuring a within the 8.2 cm. Left kidney is within normal limits. Right kidney demonstrates some nonobstructing lower pole renal stones. Hydronephrosis and hydroureter is seen which extends to the level of the urinary bladder. A small 2-3 mm stone is noted in the dependent portion of the bladder consistent with a recently passed stone. Stomach/Bowel: Diverticular change of the colon is noted. The appendix has been surgically removed. No obstructive or inflammatory changes of large or small bowel is noted. Vascular/Lymphatic: Aortic atherosclerosis. No enlarged abdominal or pelvic lymph nodes. Reproductive: Prostate is enlarged. Other: No abdominal wall hernia or abnormality. No abdominopelvic ascites. Musculoskeletal: Degenerative changes of lumbar spine are noted. IMPRESSION: Recently passed stone within the urinary bladder measuring 2-3 mm. Mild residual hydronephrosis and hydroureter is noted related to distal edema. Tiny nonobstructing right renal calculi. Hepatic and renal cysts. Electronically Signed   By: Inez Catalina M.D.   On: 03/09/2019 00:54      Discharge Exam:  Vitals:   03/11/19 0428 03/11/19 0813  BP: (!) 125/52 (!) 153/80  Pulse: 60 67  Resp: 18 20  Temp: 97.9 F (36.6 C) 97.6 F (36.4 C)  SpO2: 96% 98%   Vitals:   03/10/19 2028 03/11/19 0419 03/11/19 0428 03/11/19 0813  BP: (!) 144/71  (!) 125/52 (!) 153/80  Pulse: 68  60 67  Resp: 16  18 20   Temp: 98.6 F (37 C)  97.9 F (36.6 C) 97.6 F (36.4 C)  TempSrc: Oral  Oral Oral  SpO2: 96%  96% 98%  Weight: 95 kg 95 kg      General: Pt is alert, awake, not in acute distress Cardiovascular: RRR, S1/S2 +, no rubs,  no gallops Respiratory: CTA bilaterally, no wheezing, no rhonchi Abdominal: Soft, NT, ND, bowel sounds + Extremities: no edema, no cyanosis    The results of significant diagnostics from this hospitalization (including imaging, microbiology, ancillary and laboratory) are listed below for reference.     Microbiology: Recent Results (from the past 240 hour(s))  Culture, blood (routine x 2)     Status: None (Preliminary result)   Collection Time: 03/08/19 11:22 PM   Specimen: BLOOD LEFT HAND  Result Value Ref Range Status   Specimen Description BLOOD LEFT HAND  Final   Special Requests   Final    BOTTLES DRAWN AEROBIC AND ANAEROBIC Blood Culture results may not be optimal due to an inadequate volume of blood received in culture bottles   Culture   Final    NO GROWTH 2 DAYS Performed at [facility], [place] [address], [place], [place] [address]    Report Status PENDING  Incomplete  SARS CORONAVIRUS 2 (TAT 6-24 HRS) Nasopharyngeal Nasopharyngeal Swab     Status: None   Collection Time: 03/08/19 11:45 PM   Specimen: Nasopharyngeal Swab  Result Value Ref Range Status   SARS Coronavirus 2 NEGATIVE NEGATIVE Final    Comment: (NOTE) SARS-CoV-2 target nucleic acids are NOT DETECTED. The SARS-CoV-2 RNA is generally detectable in upper and lower respiratory specimens during the acute phase of infection. Negative results do not preclude SARS-CoV-2 infection, do not rule out co-infections with other pathogens, and should not be used as the sole basis for treatment or other patient management decisions. Negative results must be combined with clinical observations, patient history, and epidemiological information. The expected result is Negative. Fact Sheet for Patients: SugarRoll.be Fact Sheet for Healthcare Providers: https://www.woods-mathews.com/ This test is not yet approved or cleared by the [place] FDA and  has been authorized for  detection and/or diagnosis of SARS-CoV-2 by FDA under an Emergency Use Authorization (EUA). This EUA will remain  in effect (meaning this test can be used) for the duration of the COVID-19 declaration under Section 56 4(b)(1) of the Act, 21 U.S.C. section 360bbb-3(b)(1), unless the authorization is terminated or revoked sooner. Performed at [facility], [place] [address], [place], [place] [address]   Culture, blood (routine x 2)     Status: None (Preliminary result)   Collection Time: 03/08/19 11:50 PM   Specimen: BLOOD  Result Value Ref Range Status   Specimen Description BLOOD RIGHT ANTECUBITAL  Final   Special Requests   Final    BOTTLES DRAWN AEROBIC AND ANAEROBIC Blood Culture results may not be optimal due to an excessive volume of blood received in culture bottles   Culture   Final    NO GROWTH 2 DAYS Performed at [facility], [place]  [address], [place], [place] [address]    Report Status PENDING  Incomplete  Urine culture     Status: None   Collection Time: 03/09/19  1:28 AM   Specimen: Urine, Random  Result Value Ref Range Status   Specimen Description URINE, RANDOM  Final   Special Requests NONE  Final   Culture   Final    NO GROWTH Performed at [facility], [address] [place], [place] [address]    Report Status 03/10/2019 FINAL  Final     Labs: BNP (last 3 results) No results for input(s): BNP in the last 8760 hours. Basic Metabolic Panel: Recent Labs  Lab 03/08/19 1621 03/09/19 0501 03/10/19 0438 03/11/19 0618  NA 141 145 145 144  K 4.0 4.4 4.2 4.2  CL 109 109 113* 111  CO2 20* 23 23 23   GLUCOSE 163* 113* 112* 107*  BUN 52* 52* 36* 30*  CREATININE 2.32* 2.32* 1.71* 1.23  CALCIUM 10.1 9.9 9.3 9.7   Liver Function Tests: Recent Labs  Lab 03/08/19 1621 03/09/19 0501  AST 43* 33  ALT 33 31  ALKPHOS 44 34*  BILITOT 1.1 0.9  PROT 7.1 6.6  ALBUMIN 3.3* 2.9*   No results for input(s): LIPASE, AMYLASE in the last 168 hours. No  results for input(s): AMMONIA in the last 168 hours. CBC: Recent Labs  Lab 03/08/19 1621 03/09/19 0501 03/10/19 0438  WBC 9.0 8.6 6.1  NEUTROABS  --  5.4  --   HGB 14.5 13.6 12.5*  HCT 44.7 42.0 39.1  MCV 87.3 87.3 88.1  PLT 189 174 179   Cardiac Enzymes: No results for input(s): CKTOTAL, CKMB, CKMBINDEX, TROPONINI in the last 168 hours. BNP: Invalid input(s): POCBNP CBG: No results for input(s): GLUCAP in the last 168 hours. D-Dimer No results for input(s): DDIMER in the last 72 hours. Hgb A1c No results for input(s): HGBA1C in the last 72 hours. Lipid Profile No results for input(s): CHOL, HDL, LDLCALC, TRIG, CHOLHDL, LDLDIRECT in the last 72 hours. Thyroid function studies No results for input(s): TSH, T4TOTAL, T3FREE, THYROIDAB in the last 72 hours.  Invalid input(s): FREET3 Anemia work up No results for input(s): VITAMINB12, FOLATE, FERRITIN, TIBC, IRON, RETICCTPCT in the last 72 hours. Urinalysis    Component Value Date/Time   COLORURINE AMBER (A) 03/08/2019 1938   APPEARANCEUR HAZY (A) 03/08/2019 1938   LABSPEC 1.028 03/08/2019 1938   PHURINE 5.0 03/08/2019 1938   GLUCOSEU NEGATIVE 03/08/2019 1938   HGBUR LARGE (A) 03/08/2019 [place] NEGATIVE 03/08/2019 [place] NEGATIVE 03/08/2019 1938   PROTEINUR 30 (A) 03/08/2019 1938   NITRITE POSITIVE (A) 03/08/2019 1938   LEUKOCYTESUR NEGATIVE 03/08/2019 1938   Sepsis Labs Invalid input(s): PROCALCITONIN,  WBC,  LACTICIDVEN Microbiology Recent Results (from the past 240 hour(s))  Culture, blood (routine x 2)     Status: None (Preliminary result)   Collection Time: 03/08/19 11:22 PM   Specimen: BLOOD LEFT HAND  Result Value Ref Range Status   Specimen Description BLOOD LEFT HAND  Final   Special Requests   Final    BOTTLES DRAWN AEROBIC AND ANAEROBIC Blood Culture results may not be optimal due to an inadequate volume of blood received in culture bottles   Culture   Final    NO GROWTH 2  DAYS Performed at [facility], [place] [address] [place], [place] [address]    Report Status PENDING  Incomplete  SARS CORONAVIRUS 2 (TAT 6-24 HRS)  Nasopharyngeal Nasopharyngeal Swab     Status: None   Collection Time: 03/08/19 11:45 PM   Specimen: Nasopharyngeal Swab  Result Value Ref Range Status   SARS Coronavirus 2 NEGATIVE NEGATIVE Final    Comment: (NOTE) SARS-CoV-2 target nucleic acids are NOT DETECTED. The SARS-CoV-2 RNA is generally detectable in upper and lower respiratory specimens during the acute phase of infection. Negative results do not preclude SARS-CoV-2 infection, do not rule out co-infections with other pathogens, and should not be used as the sole basis for treatment or other patient management decisions. Negative results must be combined with clinical observations, patient history, and epidemiological information. The expected result is Negative. Fact Sheet for Patients: SugarRoll.be Fact Sheet for Healthcare Providers: https://www.woods-mathews.com/ This test is not yet approved or cleared by the [place] FDA and  has been authorized for detection and/or diagnosis of SARS-CoV-2 by FDA under an Emergency Use Authorization (EUA). This EUA will remain  in effect (meaning this test can be used) for the duration of the COVID-19 declaration under Section 56 4(b)(1) of the Act, 21 U.S.C. section 360bbb-3(b)(1), unless the authorization is terminated or revoked sooner. Performed at [facility], [place] [address], [place], [place] [address]   Culture, blood (routine x 2)     Status: None (Preliminary result)   Collection Time: 03/08/19 11:50 PM   Specimen: BLOOD  Result Value Ref Range Status   Specimen Description BLOOD RIGHT ANTECUBITAL  Final   Special Requests   Final    BOTTLES DRAWN AEROBIC AND ANAEROBIC Blood Culture results may not be optimal due to an excessive volume of blood received in culture  bottles   Culture   Final    NO GROWTH 2 DAYS Performed at [facility], [place] [address] [place], [place] [address]    Report Status PENDING  Incomplete  Urine culture     Status: None   Collection Time: 03/09/19  1:28 AM   Specimen: Urine, Random  Result Value Ref Range Status   Specimen Description URINE, RANDOM  Final   Special Requests NONE  Final   Culture   Final    NO GROWTH Performed at [facility], [place] [address] [place], [place] [address]    Report Status 03/10/2019 FINAL  Final     Time coordinating discharge: Over 30 minutes  SIGNED:   Darliss Cheney, MD  [facility] 03/11/2019, 8:46 AM  If 7PM-7AM, please contact night-coverage [email or web address] Password [email or web address]

## 2019-03-11 NOTE — TOC Initial Note (Addendum)
Transition of Care [facility]) - Initial/Assessment Note    Patient Details  Name: Mathew Ochoa MRN: [id number] Date of Birth: 08/31/1932  [facility] CM/SW Contact:    Bartholomew Crews, RN Phone Number: [phone] 03/11/2019, 9:55 AM  Clinical Narrative:                 Spoke with patient and spouse at the bedside. PTA patient home with spouse. Discussed recommendations for [facility] RN, PT, OT. Offered choice. Referral pending.   Verified PCP - Lavone Orn - [place] - Dr. Maxwell Caul retired. No concerns about medications. Noted PT note that patient already has rollator and cane at home. Spouse to provide transportation home.   TOC team following for transition needs.   Expected Discharge Plan: [place] Barriers to Discharge: No Barriers Identified   Patient Goals and CMS Choice Patient states their goals for this hospitalization and ongoing recovery are:: return home with spouse CMS Medicare.gov Compare Post Acute Care list provided to:: Patient Choice offered to / list presented to : Patient, Spouse  Expected Discharge Plan and Services Expected Discharge Plan: [place] In-house Referral: NA Discharge Planning Services: CM Consult Post Acute Care Choice: [place] arrangements for the past 2 months: Single Family Home Expected Discharge Date: 03/11/19               DME Arranged: N/A         HH Arranged: RN, PT, OT          Prior Living Arrangements/Services Living arrangements for the past 2 months: Single Family Home Lives with:: Self, Spouse Patient language and need for interpreter reviewed:: Yes Do you feel safe going back to the place where you live?: Yes      Need for Family Participation in Patient Care: Yes (Comment) Care giver support system in place?: Yes (comment) Current home services: DME Criminal Activity/Legal Involvement Pertinent to Current Situation/Hospitalization: No - Comment as  needed  Activities of Daily Living Home Assistive Devices/Equipment: None ADL Screening (condition at time of admission) Patient's cognitive ability adequate to safely complete daily activities?: Yes Is the patient deaf or have difficulty hearing?: No Does the patient have difficulty seeing, even when wearing glasses/contacts?: No Does the patient have difficulty concentrating, remembering, or making decisions?: Yes Patient able to express need for assistance with ADLs?: Yes Does the patient have difficulty dressing or bathing?: No Independently performs ADLs?: Yes (appropriate for developmental age) Does the patient have difficulty walking or climbing stairs?: No Weakness of Legs: None Weakness of Arms/Hands: None  Permission Sought/Granted Permission sought to share information with : Family Supports Permission granted to share information with : Yes, Verbal Permission Granted        Permission granted to share info w Relationship: spouse     Emotional Assessment Appearance:: Appears stated age Attitude/Demeanor/Rapport: Engaged Affect (typically observed): Accepting Orientation: : Oriented to Self, Oriented to Place, Oriented to  Time, Oriented to Situation Alcohol / Substance Use: Not Applicable Psych Involvement: No (comment)  Admission diagnosis:  Kidney stone [N20.0] ARF (acute renal failure) (HCC) [N17.9] Acute cystitis with hematuria [N30.01] AKI (acute kidney injury) ([place]) [N17.9] Patient Active Problem List   Diagnosis Date Noted  . Acute metabolic encephalopathy 0000000  . ARF (acute renal failure) ([place]) 03/09/2019  . Acute lower UTI 03/09/2019  . Neuropathy 07/19/2012   PCP:  Lavone Orn, MD Pharmacy:   [place], [place] -  [place] [address] Phone: [phone] Fax: [phone]     Social Determinants of Health (SDOH) Interventions    Readmission Risk  Interventions No flowsheet data found.

## 2019-03-11 NOTE — TOC Transition Note (Signed)
Transition of Care [facility]) - CM/SW Discharge Note   Patient Details  Name: Mathew Ochoa MRN: [id number] Date of Birth: 1932/09/22  [facility]) CM/SW Contact:  Bartholomew Crews, RN Phone Number: [phone] 03/11/2019, 10:19 AM   Clinical Narrative:    Referral accepted for [facility] RN, PT, and OT by [place].    Final next level of care: [place] Barriers to Discharge: No Barriers Identified   Patient Goals and CMS Choice Patient states their goals for this hospitalization and ongoing recovery are:: return home with spouse CMS Medicare.gov Compare Post Acute Care list provided to:: Patient Choice offered to / list presented to : Patient, Spouse  Discharge Placement                       Discharge Plan and Services In-house Referral: NA Discharge Planning Services: CM Consult Post Acute Care Choice: Home Health          DME Arranged: N/A         HH Arranged: RN, PT, OT   Date HH Agency Contacted: 03/11/19 Time [place]: 66 Representative spoke with at [place]: [place] ([place]) Interventions     Readmission Risk Interventions No flowsheet data found.

## 2019-03-11 NOTE — Discharge Instructions (Signed)
Urinary Tract Infection, Adult A urinary tract infection (UTI) is an infection of any part of the urinary tract. The urinary tract includes:  The kidneys.  The ureters.  The bladder.  The urethra. These organs make, store, and get rid of pee (urine) in the body. What are the causes? This is caused by germs (bacteria) in your genital area. These germs grow and cause swelling (inflammation) of your urinary tract. What increases the risk? You are more likely to develop this condition if:  You have a small, thin tube (catheter) to drain pee.  You cannot control when you pee or poop (incontinence).  You are male, and: ? You use these methods to prevent pregnancy: ? A medicine that kills sperm (spermicide). ? A device that blocks sperm (diaphragm). ? You have low levels of a male hormone (estrogen). ? You are pregnant.  You have genes that add to your risk.  You are sexually active.  You take antibiotic medicines.  You have trouble peeing because of: ? A prostate that is bigger than normal, if you are male. ? A blockage in the part of your body that drains pee from the bladder (urethra). ? A kidney stone. ? A nerve condition that affects your bladder (neurogenic bladder). ? Not getting enough to drink. ? Not peeing often enough.  You have other conditions, such as: ? Diabetes. ? A weak disease-fighting system (immune system). ? Sickle cell disease. ? Gout. ? Injury of the spine. What are the signs or symptoms? Symptoms of this condition include:  Needing to pee right away (urgently).  Peeing often.  Peeing small amounts often.  Pain or burning when peeing.  Blood in the pee.  Pee that smells bad or not like normal.  Trouble peeing.  Pee that is cloudy.  Fluid coming from the vagina, if you are male.  Pain in the belly or lower back. Other symptoms include:  Throwing up (vomiting).  No urge to eat.  Feeling mixed up (confused).  Being tired  and grouchy (irritable).  A fever.  Watery poop (diarrhea). How is this treated? This condition may be treated with:  Antibiotic medicine.  Other medicines.  Drinking enough water. Follow these instructions at home:  Medicines  Take over-the-counter and prescription medicines only as told by your doctor.  If you were prescribed an antibiotic medicine, take it as told by your doctor. Do not stop taking it even if you start to feel better. General instructions  Make sure you: ? Pee until your bladder is empty. ? Do not hold pee for a long time. ? Empty your bladder after sex. ? Wipe from front to back after pooping if you are a male. Use each tissue one time when you wipe.  Drink enough fluid to keep your pee pale yellow.  Keep all follow-up visits as told by your doctor. This is important. Contact a doctor if:  You do not get better after 1-2 days.  Your symptoms go away and then come back. Get help right away if:  You have very bad back pain.  You have very bad pain in your lower belly.  You have a fever.  You are sick to your stomach (nauseous).  You are throwing up. Summary  A urinary tract infection (UTI) is an infection of any part of the urinary tract.  This condition is caused by germs in your genital area.  There are many risk factors for a UTI. These include having a small, thin   tube to drain pee and not being able to control when you pee or poop.  Treatment includes antibiotic medicines for germs.  Drink enough fluid to keep your pee pale yellow. This information is not intended to replace advice given to you by your health care provider. Make sure you discuss any questions you have with your health care provider. Document Released: 08/31/2007 Document Revised: 03/01/2018 Document Reviewed: 09/21/2017 Elsevier Patient Education  2020 Elsevier Inc.  

## 2019-03-13 DIAGNOSIS — G510 Bell's palsy: Secondary | ICD-10-CM | POA: Diagnosis not present

## 2019-03-13 DIAGNOSIS — E7439 Other disorders of intestinal carbohydrate absorption: Secondary | ICD-10-CM | POA: Diagnosis not present

## 2019-03-13 DIAGNOSIS — G4733 Obstructive sleep apnea (adult) (pediatric): Secondary | ICD-10-CM | POA: Diagnosis not present

## 2019-03-13 DIAGNOSIS — G9341 Metabolic encephalopathy: Secondary | ICD-10-CM | POA: Diagnosis not present

## 2019-03-13 DIAGNOSIS — G379 Demyelinating disease of central nervous system, unspecified: Secondary | ICD-10-CM | POA: Diagnosis not present

## 2019-03-13 DIAGNOSIS — M545 Low back pain: Secondary | ICD-10-CM | POA: Diagnosis not present

## 2019-03-13 DIAGNOSIS — G629 Polyneuropathy, unspecified: Secondary | ICD-10-CM | POA: Diagnosis not present

## 2019-03-13 DIAGNOSIS — M160 Bilateral primary osteoarthritis of hip: Secondary | ICD-10-CM | POA: Diagnosis not present

## 2019-03-13 DIAGNOSIS — N390 Urinary tract infection, site not specified: Secondary | ICD-10-CM | POA: Diagnosis not present

## 2019-03-13 DIAGNOSIS — H409 Unspecified glaucoma: Secondary | ICD-10-CM | POA: Diagnosis not present

## 2019-03-14 DIAGNOSIS — G379 Demyelinating disease of central nervous system, unspecified: Secondary | ICD-10-CM | POA: Diagnosis not present

## 2019-03-14 DIAGNOSIS — M160 Bilateral primary osteoarthritis of hip: Secondary | ICD-10-CM | POA: Diagnosis not present

## 2019-03-14 DIAGNOSIS — G4733 Obstructive sleep apnea (adult) (pediatric): Secondary | ICD-10-CM | POA: Diagnosis not present

## 2019-03-14 DIAGNOSIS — E7439 Other disorders of intestinal carbohydrate absorption: Secondary | ICD-10-CM | POA: Diagnosis not present

## 2019-03-14 DIAGNOSIS — M545 Low back pain: Secondary | ICD-10-CM | POA: Diagnosis not present

## 2019-03-14 DIAGNOSIS — G629 Polyneuropathy, unspecified: Secondary | ICD-10-CM | POA: Diagnosis not present

## 2019-03-14 DIAGNOSIS — G9341 Metabolic encephalopathy: Secondary | ICD-10-CM | POA: Diagnosis not present

## 2019-03-14 DIAGNOSIS — N390 Urinary tract infection, site not specified: Secondary | ICD-10-CM | POA: Diagnosis not present

## 2019-03-14 DIAGNOSIS — G510 Bell's palsy: Secondary | ICD-10-CM | POA: Diagnosis not present

## 2019-03-14 DIAGNOSIS — H409 Unspecified glaucoma: Secondary | ICD-10-CM | POA: Diagnosis not present

## 2019-03-14 LAB — CULTURE, BLOOD (ROUTINE X 2)
Culture: NO GROWTH
Culture: NO GROWTH

## 2019-03-25 DIAGNOSIS — G510 Bell's palsy: Secondary | ICD-10-CM | POA: Diagnosis not present

## 2019-03-25 DIAGNOSIS — N390 Urinary tract infection, site not specified: Secondary | ICD-10-CM | POA: Diagnosis not present

## 2019-03-25 DIAGNOSIS — H409 Unspecified glaucoma: Secondary | ICD-10-CM | POA: Diagnosis not present

## 2019-03-25 DIAGNOSIS — M545 Low back pain: Secondary | ICD-10-CM | POA: Diagnosis not present

## 2019-03-25 DIAGNOSIS — G9341 Metabolic encephalopathy: Secondary | ICD-10-CM | POA: Diagnosis not present

## 2019-03-25 DIAGNOSIS — G4733 Obstructive sleep apnea (adult) (pediatric): Secondary | ICD-10-CM | POA: Diagnosis not present

## 2019-03-25 DIAGNOSIS — E7439 Other disorders of intestinal carbohydrate absorption: Secondary | ICD-10-CM | POA: Diagnosis not present

## 2019-03-25 DIAGNOSIS — G379 Demyelinating disease of central nervous system, unspecified: Secondary | ICD-10-CM | POA: Diagnosis not present

## 2019-03-25 DIAGNOSIS — G629 Polyneuropathy, unspecified: Secondary | ICD-10-CM | POA: Diagnosis not present

## 2019-03-25 DIAGNOSIS — M160 Bilateral primary osteoarthritis of hip: Secondary | ICD-10-CM | POA: Diagnosis not present

## 2019-04-01 DIAGNOSIS — R69 Illness, unspecified: Secondary | ICD-10-CM | POA: Diagnosis not present

## 2019-04-01 DIAGNOSIS — N178 Other acute kidney failure: Secondary | ICD-10-CM | POA: Diagnosis not present

## 2019-04-01 DIAGNOSIS — N200 Calculus of kidney: Secondary | ICD-10-CM | POA: Diagnosis not present

## 2019-04-04 DIAGNOSIS — H401123 Primary open-angle glaucoma, left eye, severe stage: Secondary | ICD-10-CM | POA: Diagnosis not present

## 2019-04-04 DIAGNOSIS — Z79899 Other long term (current) drug therapy: Secondary | ICD-10-CM | POA: Diagnosis not present

## 2019-04-04 DIAGNOSIS — H401112 Primary open-angle glaucoma, right eye, moderate stage: Secondary | ICD-10-CM | POA: Diagnosis not present

## 2019-05-03 DIAGNOSIS — G3184 Mild cognitive impairment, so stated: Secondary | ICD-10-CM | POA: Diagnosis not present

## 2019-05-03 DIAGNOSIS — E559 Vitamin D deficiency, unspecified: Secondary | ICD-10-CM | POA: Diagnosis not present

## 2019-05-03 DIAGNOSIS — Z7689 Persons encountering health services in other specified circumstances: Secondary | ICD-10-CM | POA: Diagnosis not present

## 2019-05-03 DIAGNOSIS — K219 Gastro-esophageal reflux disease without esophagitis: Secondary | ICD-10-CM | POA: Diagnosis not present

## 2019-05-03 DIAGNOSIS — M169 Osteoarthritis of hip, unspecified: Secondary | ICD-10-CM | POA: Diagnosis not present

## 2019-05-03 DIAGNOSIS — G4733 Obstructive sleep apnea (adult) (pediatric): Secondary | ICD-10-CM | POA: Diagnosis not present

## 2019-05-03 DIAGNOSIS — G629 Polyneuropathy, unspecified: Secondary | ICD-10-CM | POA: Diagnosis not present

## 2019-05-03 DIAGNOSIS — R7301 Impaired fasting glucose: Secondary | ICD-10-CM | POA: Diagnosis not present

## 2019-05-03 DIAGNOSIS — I10 Essential (primary) hypertension: Secondary | ICD-10-CM | POA: Diagnosis not present

## 2019-05-03 DIAGNOSIS — L603 Nail dystrophy: Secondary | ICD-10-CM | POA: Diagnosis not present

## 2019-05-03 DIAGNOSIS — E7849 Other hyperlipidemia: Secondary | ICD-10-CM | POA: Diagnosis not present

## 2019-05-03 DIAGNOSIS — N1831 Chronic kidney disease, stage 3a: Secondary | ICD-10-CM | POA: Diagnosis not present

## 2019-05-03 DIAGNOSIS — H409 Unspecified glaucoma: Secondary | ICD-10-CM | POA: Diagnosis not present

## 2019-05-23 ENCOUNTER — Ambulatory Visit: Payer: Medicare HMO | Attending: Internal Medicine

## 2019-05-23 DIAGNOSIS — Z23 Encounter for immunization: Secondary | ICD-10-CM | POA: Insufficient documentation

## 2019-05-23 NOTE — Progress Notes (Signed)
   Covid-19 Vaccination Clinic  Name:  Mathew Ochoa    MRN: [id number] DOB: [birth year]  05/23/2019  [name] was observed post Covid-19 immunization for 15 minutes without incidence. He was provided with Vaccine Information Sheet and instruction to access the V-Safe system.   [name] was instructed to call 911 with any severe reactions post vaccine: Marland Kitchen Difficulty breathing  . Swelling of your face and throat  . A fast heartbeat  . A bad rash all over your body  . Dizziness and weakness    Immunizations Administered    Name Date Dose VIS Date Route   Pfizer COVID-19 Vaccine 05/23/2019 12:48 PM 0.3 mL 03/08/2019 Intramuscular   Manufacturer: [place]   Lot: [id number]   [place]: [id number]

## 2019-05-27 DIAGNOSIS — R310 Gross hematuria: Secondary | ICD-10-CM | POA: Diagnosis not present

## 2019-05-27 DIAGNOSIS — N202 Calculus of kidney with calculus of ureter: Secondary | ICD-10-CM | POA: Diagnosis not present

## 2019-05-27 DIAGNOSIS — D49511 Neoplasm of unspecified behavior of right kidney: Secondary | ICD-10-CM | POA: Diagnosis not present

## 2019-05-30 DIAGNOSIS — R69 Illness, unspecified: Secondary | ICD-10-CM | POA: Diagnosis not present

## 2019-06-12 DIAGNOSIS — N200 Calculus of kidney: Secondary | ICD-10-CM | POA: Diagnosis not present

## 2019-06-12 DIAGNOSIS — R310 Gross hematuria: Secondary | ICD-10-CM | POA: Diagnosis not present

## 2019-06-13 ENCOUNTER — Ambulatory Visit: Payer: Medicare HMO | Admitting: Neurology

## 2019-06-17 ENCOUNTER — Other Ambulatory Visit: Payer: Self-pay

## 2019-06-17 ENCOUNTER — Encounter: Payer: Self-pay | Admitting: Neurology

## 2019-06-17 ENCOUNTER — Ambulatory Visit: Payer: Medicare HMO | Admitting: Neurology

## 2019-06-17 VITALS — BP 158/78 | HR 64 | Ht 69.0 in | Wt 221.4 lb

## 2019-06-17 DIAGNOSIS — G301 Alzheimer's disease with late onset: Secondary | ICD-10-CM | POA: Diagnosis not present

## 2019-06-17 DIAGNOSIS — F0281 Dementia in other diseases classified elsewhere with behavioral disturbance: Secondary | ICD-10-CM

## 2019-06-17 DIAGNOSIS — R69 Illness, unspecified: Secondary | ICD-10-CM | POA: Diagnosis not present

## 2019-06-17 DIAGNOSIS — F02818 Dementia in other diseases classified elsewhere, unspecified severity, with other behavioral disturbance: Secondary | ICD-10-CM

## 2019-06-17 MED ORDER — DONEPEZIL HCL 10 MG PO TABS: ORAL_TABLET | ORAL | 11 refills | Status: DC
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
## 2019-06-17 NOTE — Patient Instructions (Signed)
You have a condition called Alzheimer's dementia, which is a brain condition affecting our memory that worsens over time. Medications such as Donepezil (Aricept) can help slow down the worsening of memory. A prescription has been sent for Donepezil 10mg  1/2 tablet daily for 2 weeks, then increase to 1 tablet daily. The DMV does not let patients with dementia drive any longer.  FALL PRECAUTIONS: Be cautious when walking. Scan the area for obstacles that may increase the risk of trips and falls. When getting up in the mornings, sit up at the edge of the bed for a few minutes before getting out of bed. Consider elevating the bed at the head end to avoid drop of blood pressure when getting up. Walk always in a well-lit room (use night lights in the walls). Avoid area rugs or power cords from appliances in the middle of the walkways. Use a walker or a cane if necessary and consider physical therapy for balance exercise. Get your eyesight checked regularly.  FINANCIAL OVERSIGHT: Supervision, especially oversight when making financial decisions or transactions is also recommended.  HOME SAFETY: Consider the safety of the kitchen when operating appliances like stoves, microwave oven, and blender. Consider having supervision and share cooking responsibilities until no longer able to participate in those. Accidents with firearms and other hazards in the house should be identified and addressed as well.  DRIVING: Regarding driving, in patients with progressive memory problems, driving will be impaired. We advise to have someone else do the driving if trouble finding directions or if minor accidents are reported. Independent driving assessment is available to determine safety of driving.  ABILITY TO BE LEFT ALONE: If patient is unable to contact 911 operator, consider using LifeLine, or when the need is there, arrange for someone to stay with patients. Smoking is a fire hazard, consider supervision or cessation. Risk  of wandering should be assessed by caregiver and if detected at any point, supervision and safe proof recommendations should be instituted.  MEDICATION SUPERVISION: Inability to self-administer medication needs to be constantly addressed. Implement a mechanism to ensure safe administration of the medications.  RECOMMENDATIONS FOR ALL PATIENTS WITH MEMORY PROBLEMS: 1. Continue to exercise (Recommend 30 minutes of walking everyday, or 3 hours every week) 2. Increase social interactions - continue going to [place] and enjoy social gatherings with friends and family 3. Eat healthy, avoid fried foods and eat more fruits and vegetables 4. Maintain adequate blood pressure, blood sugar, and blood cholesterol level. Reducing the risk of stroke and cardiovascular disease also helps promoting better memory. 5. Avoid stressful situations. Live a simple life and avoid aggravations. Organize your time and prepare for the next day in anticipation. 6. Sleep well, avoid any interruptions of sleep and avoid any distractions in the bedroom that may interfere with adequate sleep quality 7. Avoid sugar, avoid sweets as there is a strong link between excessive sugar intake, diabetes, and cognitive impairment The Mediterranean diet has been shown to help patients reduce the risk of progressive memory disorders and reduces cardiovascular risk. This includes eating fish, eat fruits and green leafy vegetables, nuts like almonds and hazelnuts, walnuts, and also use olive oil. Avoid fast foods and fried foods as much as possible. Avoid sweets and sugar as sugar use has been linked to worsening of memory function.  There is always a concern of gradual progression of memory problems. If this is the case, then we may need to adjust level of care according to patient needs. Support, both to the  patient and caregiver, should then be put into place.

## 2019-06-18 ENCOUNTER — Ambulatory Visit: Payer: Medicare HMO

## 2019-06-18 ENCOUNTER — Ambulatory Visit: Payer: Medicare HMO | Attending: Internal Medicine

## 2019-06-18 DIAGNOSIS — Z23 Encounter for immunization: Secondary | ICD-10-CM

## 2019-06-18 NOTE — Progress Notes (Signed)
   Covid-19 Vaccination Clinic  Name:  Mathew Ochoa    MRN: [id number] DOB: 11/16/1932  06/18/2019  [name] was observed post Covid-19 immunization for 15 minutes without incident. He was provided with Vaccine Information Sheet and instruction to access the V-Safe system.   [name] was instructed to call 911 with any severe reactions post vaccine: Marland Kitchen Difficulty breathing  . Swelling of face and throat  . A fast heartbeat  . A bad rash all over body  . Dizziness and weakness   Immunizations Administered    Name Date Dose VIS Date Route   Pfizer COVID-19 Vaccine 06/18/2019  9:42 AM 0.3 mL 03/08/2019 Intramuscular   Manufacturer: [place]   Lot: [id number]   [place]: [id number]

## 2019-06-20 ENCOUNTER — Telehealth: Payer: Self-pay | Admitting: Neurology

## 2019-06-20 NOTE — Telephone Encounter (Signed)
Wife is calling in about needing a letter stating he can not drive- so she can take this to the [facility]. She would be able to come by office and pick it up. She said she also needs a letter that he was seen in the office on 06/17/19. Thanks!

## 2019-06-20 NOTE — Telephone Encounter (Signed)
Pt wife called and informed that Dr. Delice Lesch is not in the office today she will be back in tomorrow

## 2019-06-20 NOTE — Telephone Encounter (Signed)
Patient's wife called to check on the status of this request. She said she was hoping to pick up the letter(s) today.

## 2019-06-21 ENCOUNTER — Encounter: Payer: Self-pay | Admitting: Neurology

## 2019-06-21 NOTE — Telephone Encounter (Signed)
Letter done, thanks.

## 2019-06-21 NOTE — Telephone Encounter (Signed)
Pt called no answer letter is up front for them to come by and pick them up

## 2019-07-02 DIAGNOSIS — R310 Gross hematuria: Secondary | ICD-10-CM | POA: Diagnosis not present

## 2019-07-02 DIAGNOSIS — D49511 Neoplasm of unspecified behavior of right kidney: Secondary | ICD-10-CM | POA: Diagnosis not present

## 2019-07-02 DIAGNOSIS — N202 Calculus of kidney with calculus of ureter: Secondary | ICD-10-CM | POA: Diagnosis not present

## 2019-07-09 ENCOUNTER — Telehealth: Payer: Self-pay | Admitting: Neurology

## 2019-07-09 NOTE — Telephone Encounter (Signed)
Pt called no answer no voice mail picked up will try again later  

## 2019-07-09 NOTE — Telephone Encounter (Signed)
Some patients are on a higher dose, although I usually don't go above 10mg . Main thing to keep an eye on is his heart rate (may go lower) or dizziness. Pls have her administer medications from now on. Thanks

## 2019-07-09 NOTE — Telephone Encounter (Signed)
Patient ran out of the  Medication Donepezil. She just found out the he was taking double the dosage of medication. She needs to speak to someone about the dangers of him taking too much of the medication

## 2019-07-10 NOTE — Telephone Encounter (Signed)
Pt wife called informed to keep an eye on is his heart rate (may go lower) or dizziness. Letter for DMV is up front asking if we can mail it to them ,

## 2019-07-11 DIAGNOSIS — E785 Hyperlipidemia, unspecified: Secondary | ICD-10-CM | POA: Diagnosis not present

## 2019-07-11 DIAGNOSIS — R3121 Asymptomatic microscopic hematuria: Secondary | ICD-10-CM | POA: Diagnosis not present

## 2019-07-11 DIAGNOSIS — G4733 Obstructive sleep apnea (adult) (pediatric): Secondary | ICD-10-CM | POA: Diagnosis not present

## 2019-07-11 DIAGNOSIS — N1831 Chronic kidney disease, stage 3a: Secondary | ICD-10-CM | POA: Diagnosis not present

## 2019-07-11 DIAGNOSIS — R330 Drug induced retention of urine: Secondary | ICD-10-CM | POA: Diagnosis not present

## 2019-07-11 DIAGNOSIS — R69 Illness, unspecified: Secondary | ICD-10-CM | POA: Diagnosis not present

## 2019-07-11 DIAGNOSIS — E559 Vitamin D deficiency, unspecified: Secondary | ICD-10-CM | POA: Diagnosis not present

## 2019-07-11 DIAGNOSIS — L603 Nail dystrophy: Secondary | ICD-10-CM | POA: Diagnosis not present

## 2019-07-11 DIAGNOSIS — I10 Essential (primary) hypertension: Secondary | ICD-10-CM | POA: Diagnosis not present

## 2019-07-11 DIAGNOSIS — M169 Osteoarthritis of hip, unspecified: Secondary | ICD-10-CM | POA: Diagnosis not present

## 2019-08-20 ENCOUNTER — Telehealth: Payer: Self-pay | Admitting: Neurology

## 2019-08-20 DIAGNOSIS — R330 Drug induced retention of urine: Secondary | ICD-10-CM | POA: Diagnosis not present

## 2019-08-20 DIAGNOSIS — G4700 Insomnia, unspecified: Secondary | ICD-10-CM | POA: Diagnosis not present

## 2019-08-20 DIAGNOSIS — G629 Polyneuropathy, unspecified: Secondary | ICD-10-CM | POA: Diagnosis not present

## 2019-08-20 DIAGNOSIS — I10 Essential (primary) hypertension: Secondary | ICD-10-CM | POA: Diagnosis not present

## 2019-08-20 DIAGNOSIS — L603 Nail dystrophy: Secondary | ICD-10-CM | POA: Diagnosis not present

## 2019-08-20 DIAGNOSIS — G4733 Obstructive sleep apnea (adult) (pediatric): Secondary | ICD-10-CM | POA: Diagnosis not present

## 2019-08-20 DIAGNOSIS — R69 Illness, unspecified: Secondary | ICD-10-CM | POA: Diagnosis not present

## 2019-08-20 NOTE — Telephone Encounter (Signed)
The patient's wife called in to try and get an earlier appointment than the current 01/17/20 scheduled appointment. She said the patient saw Dr. Sueanne Margarita today and Dr. Francesco Sor took him off of his dementia medication due to the side effects. The patient is not currently taking any medication for the dementia.

## 2019-08-28 NOTE — Telephone Encounter (Signed)
Patient scheduled for 6/14.

## 2019-08-28 NOTE — Telephone Encounter (Signed)
Patient's wife called to check on the status of the request.

## 2019-09-09 ENCOUNTER — Other Ambulatory Visit: Payer: Self-pay

## 2019-09-09 ENCOUNTER — Ambulatory Visit: Payer: Medicare HMO | Admitting: Neurology

## 2019-09-09 ENCOUNTER — Encounter: Payer: Self-pay | Admitting: Neurology

## 2019-09-09 VITALS — BP 124/69 | HR 76 | Ht 69.0 in | Wt 222.2 lb

## 2019-09-09 DIAGNOSIS — F0281 Dementia in other diseases classified elsewhere with behavioral disturbance: Secondary | ICD-10-CM | POA: Diagnosis not present

## 2019-09-09 DIAGNOSIS — G301 Alzheimer's disease with late onset: Secondary | ICD-10-CM

## 2019-09-09 DIAGNOSIS — R69 Illness, unspecified: Secondary | ICD-10-CM | POA: Diagnosis not present

## 2019-09-09 MED ORDER — DIVALPROEX SODIUM ER 250 MG PO TB24: ORAL_TABLET | ORAL | 11 refills | Status: DC
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
## 2019-09-09 NOTE — Patient Instructions (Addendum)
1. Stop the Seroquel  2. Start the Depakote ER 250mg : Take 1 tablet every night  3. Call our office after a month, we will plan to start a different medication for dementia called Namenda  4. Follow-up as scheduled in October, call for any changes  FALL PRECAUTIONS: Be cautious when walking. Scan the area for obstacles that may increase the risk of trips and falls. When getting up in the mornings, sit up at the edge of the bed for a few minutes before getting out of bed. Consider elevating the bed at the head end to avoid drop of blood pressure when getting up. Walk always in a well-lit room (use night lights in the walls). Avoid area rugs or power cords from appliances in the middle of the walkways. Use a walker or a cane if necessary and consider physical therapy for balance exercise. Get your eyesight checked regularly.  FINANCIAL OVERSIGHT: Supervision, especially oversight when making financial decisions or transactions is also recommended.  HOME SAFETY: Consider the safety of the kitchen when operating appliances like stoves, microwave oven, and blender. Consider having supervision and share cooking responsibilities until no longer able to participate in those. Accidents with firearms and other hazards in the house should be identified and addressed as well.  ABILITY TO BE LEFT ALONE: If patient is unable to contact 911 operator, consider using LifeLine, or when the need is there, arrange for someone to stay with patients. Smoking is a fire hazard, consider supervision or cessation. Risk of wandering should be assessed by caregiver and if detected at any point, supervision and safe proof recommendations should be instituted.  MEDICATION SUPERVISION: Inability to self-administer medication needs to be constantly addressed. Implement a mechanism to ensure safe administration of the medications.  RECOMMENDATIONS FOR ALL PATIENTS WITH MEMORY PROBLEMS: 1. Continue to exercise (Recommend 30  minutes of walking everyday, or 3 hours every week) 2. Increase social interactions - continue going to [place] and enjoy social gatherings with friends and family 3. Eat healthy, avoid fried foods and eat more fruits and vegetables 4. Maintain adequate blood pressure, blood sugar, and blood cholesterol level. Reducing the risk of stroke and cardiovascular disease also helps promoting better memory. 5. Avoid stressful situations. Live a simple life and avoid aggravations. Organize your time and prepare for the next day in anticipation. 6. Sleep well, avoid any interruptions of sleep and avoid any distractions in the bedroom that may interfere with adequate sleep quality 7. Avoid sugar, avoid sweets as there is a strong link between excessive sugar intake, diabetes, and cognitive impairment The Mediterranean diet has been shown to help patients reduce the risk of progressive memory disorders and reduces cardiovascular risk. This includes eating fish, eat fruits and green leafy vegetables, nuts like almonds and hazelnuts, walnuts, and also use olive oil. Avoid fast foods and fried foods as much as possible. Avoid sweets and sugar as sugar use has been linked to worsening of memory function.  There is always a concern of gradual progression of memory problems. If this is the case, then we may need to adjust level of care according to patient needs. Support, both to the patient and caregiver, should then be put into place.

## 2019-10-17 ENCOUNTER — Telehealth: Payer: Self-pay | Admitting: Neurology

## 2019-10-17 NOTE — Telephone Encounter (Signed)
Patient's wife called in wanting to give an update on the patient. She said he is going well. He says he can't tell a difference. They are ready for him to start the different dementia medication when Dr. Delice Lesch is ready.

## 2019-10-18 MED ORDER — MEMANTINE HCL 10 MG PO TABS: ORAL_TABLET | ORAL | 11 refills | Status: DC

## 2019-10-18 NOTE — Telephone Encounter (Signed)
Spoke to pt and his wife The medication we had talked about is Namenda. Side effects can include dizziness, drowsiness. Start Namenda 10mg : take 1 tablet every night for 2 weeks, then increase to 1 tablet twice a day. I have sent in a prescription to his pharmacy. Verbalized understanding,

## 2019-10-18 NOTE — Telephone Encounter (Signed)
Good to hear. The medication we had talked about is Namenda. Side effects can include dizziness, drowsiness. Start Namenda 10mg : take 1 tablet every night for 2 weeks, then increase to 1 tablet twice a day. I have sent in a prescription to his pharmacy. Thanks

## 2019-10-23 DIAGNOSIS — E669 Obesity, unspecified: Secondary | ICD-10-CM | POA: Diagnosis not present

## 2019-10-23 DIAGNOSIS — F0280 Dementia in other diseases classified elsewhere without behavioral disturbance: Secondary | ICD-10-CM | POA: Diagnosis not present

## 2019-10-23 DIAGNOSIS — K219 Gastro-esophageal reflux disease without esophagitis: Secondary | ICD-10-CM | POA: Diagnosis not present

## 2019-10-23 DIAGNOSIS — R030 Elevated blood-pressure reading, without diagnosis of hypertension: Secondary | ICD-10-CM | POA: Diagnosis not present

## 2019-10-23 DIAGNOSIS — R69 Illness, unspecified: Secondary | ICD-10-CM | POA: Diagnosis not present

## 2019-10-23 DIAGNOSIS — H409 Unspecified glaucoma: Secondary | ICD-10-CM | POA: Diagnosis not present

## 2019-10-23 DIAGNOSIS — G309 Alzheimer's disease, unspecified: Secondary | ICD-10-CM | POA: Diagnosis not present

## 2019-10-23 DIAGNOSIS — Z6832 Body mass index (BMI) 32.0-32.9, adult: Secondary | ICD-10-CM | POA: Diagnosis not present

## 2019-10-23 DIAGNOSIS — Z008 Encounter for other general examination: Secondary | ICD-10-CM | POA: Diagnosis not present

## 2019-10-23 DIAGNOSIS — Z7982 Long term (current) use of aspirin: Secondary | ICD-10-CM | POA: Diagnosis not present

## 2019-10-23 DIAGNOSIS — E785 Hyperlipidemia, unspecified: Secondary | ICD-10-CM | POA: Diagnosis not present

## 2019-11-19 DIAGNOSIS — R7989 Other specified abnormal findings of blood chemistry: Secondary | ICD-10-CM | POA: Diagnosis not present

## 2019-11-19 DIAGNOSIS — R3121 Asymptomatic microscopic hematuria: Secondary | ICD-10-CM | POA: Diagnosis not present

## 2019-11-19 DIAGNOSIS — R7301 Impaired fasting glucose: Secondary | ICD-10-CM | POA: Diagnosis not present

## 2019-11-19 DIAGNOSIS — Z7689 Persons encountering health services in other specified circumstances: Secondary | ICD-10-CM | POA: Diagnosis not present

## 2019-11-19 DIAGNOSIS — R82998 Other abnormal findings in urine: Secondary | ICD-10-CM | POA: Diagnosis not present

## 2019-11-19 DIAGNOSIS — R69 Illness, unspecified: Secondary | ICD-10-CM | POA: Diagnosis not present

## 2019-11-19 DIAGNOSIS — R600 Localized edema: Secondary | ICD-10-CM | POA: Diagnosis not present

## 2019-11-19 DIAGNOSIS — R1031 Right lower quadrant pain: Secondary | ICD-10-CM | POA: Diagnosis not present

## 2019-11-19 DIAGNOSIS — R109 Unspecified abdominal pain: Secondary | ICD-10-CM | POA: Diagnosis not present

## 2019-11-19 DIAGNOSIS — R300 Dysuria: Secondary | ICD-10-CM | POA: Diagnosis not present

## 2019-11-19 DIAGNOSIS — N1831 Chronic kidney disease, stage 3a: Secondary | ICD-10-CM | POA: Diagnosis not present

## 2019-11-26 DIAGNOSIS — N390 Urinary tract infection, site not specified: Secondary | ICD-10-CM | POA: Diagnosis not present

## 2019-11-26 DIAGNOSIS — N1831 Chronic kidney disease, stage 3a: Secondary | ICD-10-CM | POA: Diagnosis not present

## 2019-11-26 DIAGNOSIS — R109 Unspecified abdominal pain: Secondary | ICD-10-CM | POA: Diagnosis not present

## 2019-11-26 DIAGNOSIS — R7989 Other specified abnormal findings of blood chemistry: Secondary | ICD-10-CM | POA: Diagnosis not present

## 2019-11-26 DIAGNOSIS — I129 Hypertensive chronic kidney disease with stage 1 through stage 4 chronic kidney disease, or unspecified chronic kidney disease: Secondary | ICD-10-CM | POA: Diagnosis not present

## 2019-11-26 DIAGNOSIS — I240 Acute coronary thrombosis not resulting in myocardial infarction: Secondary | ICD-10-CM | POA: Diagnosis not present

## 2019-11-26 DIAGNOSIS — R600 Localized edema: Secondary | ICD-10-CM | POA: Diagnosis not present

## 2019-12-03 DIAGNOSIS — N1831 Chronic kidney disease, stage 3a: Secondary | ICD-10-CM | POA: Diagnosis not present

## 2019-12-06 DIAGNOSIS — N390 Urinary tract infection, site not specified: Secondary | ICD-10-CM | POA: Diagnosis not present

## 2019-12-06 DIAGNOSIS — I240 Acute coronary thrombosis not resulting in myocardial infarction: Secondary | ICD-10-CM | POA: Diagnosis not present

## 2019-12-06 DIAGNOSIS — I129 Hypertensive chronic kidney disease with stage 1 through stage 4 chronic kidney disease, or unspecified chronic kidney disease: Secondary | ICD-10-CM | POA: Diagnosis not present

## 2019-12-06 DIAGNOSIS — G4733 Obstructive sleep apnea (adult) (pediatric): Secondary | ICD-10-CM | POA: Diagnosis not present

## 2019-12-06 DIAGNOSIS — N1831 Chronic kidney disease, stage 3a: Secondary | ICD-10-CM | POA: Diagnosis not present

## 2019-12-06 DIAGNOSIS — G4700 Insomnia, unspecified: Secondary | ICD-10-CM | POA: Diagnosis not present

## 2019-12-06 DIAGNOSIS — R600 Localized edema: Secondary | ICD-10-CM | POA: Diagnosis not present

## 2019-12-06 DIAGNOSIS — R69 Illness, unspecified: Secondary | ICD-10-CM | POA: Diagnosis not present

## 2020-01-17 ENCOUNTER — Ambulatory Visit: Payer: Medicare HMO | Admitting: Neurology

## 2020-01-17 ENCOUNTER — Encounter: Payer: Self-pay | Admitting: Neurology

## 2020-01-17 ENCOUNTER — Other Ambulatory Visit: Payer: Self-pay

## 2020-01-17 VITALS — BP 199/81 | HR 74 | Ht 69.0 in | Wt 226.8 lb

## 2020-01-17 DIAGNOSIS — G301 Alzheimer's disease with late onset: Secondary | ICD-10-CM | POA: Diagnosis not present

## 2020-01-17 DIAGNOSIS — F0281 Dementia in other diseases classified elsewhere with behavioral disturbance: Secondary | ICD-10-CM | POA: Diagnosis not present

## 2020-01-17 DIAGNOSIS — R69 Illness, unspecified: Secondary | ICD-10-CM | POA: Diagnosis not present

## 2020-01-17 DIAGNOSIS — F02818 Dementia in other diseases classified elsewhere, unspecified severity, with other behavioral disturbance: Secondary | ICD-10-CM

## 2020-01-17 MED ORDER — ESCITALOPRAM OXALATE 10 MG PO TABS: 10.0000 mg | ORAL_TABLET | Freq: Every day | ORAL | 11 refills | Status: AC

## 2020-01-17 MED ORDER — MEMANTINE HCL 10 MG PO TABS: ORAL_TABLET | ORAL | 11 refills | Status: AC

## 2020-01-17 MED ORDER — DIVALPROEX SODIUM ER 250 MG PO TB24: ORAL_TABLET | ORAL | 11 refills | Status: AC

## 2020-01-17 NOTE — Patient Instructions (Signed)
1. Start Lexapro 10mg  daily  2. Continue Memantine 10mg  twice a day and Depakote 250mg  every night  3. Recommend doing bills and medications together  4. Follow-up in 6 months, call for any changes   FALL PRECAUTIONS: Be cautious when walking. Scan the area for obstacles that may increase the risk of trips and falls. When getting up in the mornings, sit up at the edge of the bed for a few minutes before getting out of bed. Consider elevating the bed at the head end to avoid drop of blood pressure when getting up. Walk always in a well-lit room (use night lights in the walls). Avoid area rugs or power cords from appliances in the middle of the walkways. Use a walker or a cane if necessary and consider physical therapy for balance exercise. Get your eyesight checked regularly.  FINANCIAL OVERSIGHT: Supervision, especially oversight when making financial decisions or transactions is also recommended as difficulties arise  HOME SAFETY: Consider the safety of the kitchen when operating appliances like stoves, microwave oven, and blender. Consider having supervision and share cooking responsibilities until no longer able to participate in those. Accidents with firearms and other hazards in the house should be identified and addressed as well.  ABILITY TO BE LEFT ALONE: If patient is unable to contact 911 operator, consider using LifeLine, or when the need is there, arrange for someone to stay with patients. Smoking is a fire hazard, consider supervision or cessation. Risk of wandering should be assessed by caregiver and if detected at any point, supervision and safe proof recommendations should be instituted.  MEDICATION SUPERVISION: Inability to self-administer medication needs to be constantly addressed. Implement a mechanism to ensure safe administration of the medications.  RECOMMENDATIONS FOR ALL PATIENTS WITH MEMORY PROBLEMS: 1. Continue to exercise (Recommend 30 minutes of walking everyday, or 3  hours every week) 2. Increase social interactions - continue going to [place] and enjoy social gatherings with friends and family 3. Eat healthy, avoid fried foods and eat more fruits and vegetables 4. Maintain adequate blood pressure, blood sugar, and blood cholesterol level. Reducing the risk of stroke and cardiovascular disease also helps promoting better memory. 5. Avoid stressful situations. Live a simple life and avoid aggravations. Organize your time and prepare for the next day in anticipation. 6. Sleep well, avoid any interruptions of sleep and avoid any distractions in the bedroom that may interfere with adequate sleep quality 7. Avoid sugar, avoid sweets as there is a strong link between excessive sugar intake, diabetes, and cognitive impairment The Mediterranean diet has been shown to help patients reduce the risk of progressive memory disorders and reduces cardiovascular risk. This includes eating fish, eat fruits and green leafy vegetables, nuts like almonds and hazelnuts, walnuts, and also use olive oil. Avoid fast foods and fried foods as much as possible. Avoid sweets and sugar as sugar use has been linked to worsening of memory function.  There is always a concern of gradual progression of memory problems. If this is the case, then we may need to adjust level of care according to patient needs. Support, both to the patient and caregiver, should then be put into place.
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
## 2020-02-03 DIAGNOSIS — R82998 Other abnormal findings in urine: Secondary | ICD-10-CM | POA: Diagnosis not present

## 2020-02-11 DIAGNOSIS — R3121 Asymptomatic microscopic hematuria: Secondary | ICD-10-CM | POA: Diagnosis not present

## 2020-02-12 DIAGNOSIS — N390 Urinary tract infection, site not specified: Secondary | ICD-10-CM | POA: Diagnosis not present

## 2020-02-12 DIAGNOSIS — N1831 Chronic kidney disease, stage 3a: Secondary | ICD-10-CM | POA: Diagnosis not present

## 2020-02-12 DIAGNOSIS — R69 Illness, unspecified: Secondary | ICD-10-CM | POA: Diagnosis not present

## 2020-02-12 DIAGNOSIS — I129 Hypertensive chronic kidney disease with stage 1 through stage 4 chronic kidney disease, or unspecified chronic kidney disease: Secondary | ICD-10-CM | POA: Diagnosis not present

## 2020-02-12 DIAGNOSIS — R531 Weakness: Secondary | ICD-10-CM | POA: Diagnosis not present

## 2020-02-12 DIAGNOSIS — R600 Localized edema: Secondary | ICD-10-CM | POA: Diagnosis not present

## 2020-03-09 DIAGNOSIS — R3915 Urgency of urination: Secondary | ICD-10-CM | POA: Diagnosis not present

## 2020-03-09 DIAGNOSIS — N202 Calculus of kidney with calculus of ureter: Secondary | ICD-10-CM | POA: Diagnosis not present

## 2020-03-09 DIAGNOSIS — R310 Gross hematuria: Secondary | ICD-10-CM | POA: Diagnosis not present

## 2020-03-24 ENCOUNTER — Telehealth: Payer: Self-pay

## 2020-03-24 NOTE — Telephone Encounter (Signed)
Spoke with patient's son Zyair and scheduled an in-person Palliative Consult for 03/26/20 @ 12 PM   COVID screening was negative. No pets in home. Patient lives with wife Darl Pikes.   Consent obtained; updated Outlook/Netsmart/Team List and Epic.

## 2020-03-26 ENCOUNTER — Other Ambulatory Visit: Payer: Self-pay

## 2020-03-26 ENCOUNTER — Other Ambulatory Visit: Payer: Medicare HMO | Admitting: Internal Medicine

## 2020-04-02 DIAGNOSIS — R600 Localized edema: Secondary | ICD-10-CM | POA: Diagnosis not present

## 2020-04-02 DIAGNOSIS — R531 Weakness: Secondary | ICD-10-CM | POA: Diagnosis not present

## 2020-04-02 DIAGNOSIS — R69 Illness, unspecified: Secondary | ICD-10-CM | POA: Diagnosis not present

## 2020-04-02 DIAGNOSIS — R627 Adult failure to thrive: Secondary | ICD-10-CM | POA: Diagnosis not present

## 2020-05-03 DIAGNOSIS — R627 Adult failure to thrive: Secondary | ICD-10-CM | POA: Diagnosis not present

## 2020-05-03 DIAGNOSIS — R531 Weakness: Secondary | ICD-10-CM | POA: Diagnosis not present

## 2020-05-03 DIAGNOSIS — R69 Illness, unspecified: Secondary | ICD-10-CM | POA: Diagnosis not present

## 2020-05-03 DIAGNOSIS — R600 Localized edema: Secondary | ICD-10-CM | POA: Diagnosis not present

## 2020-05-11 DIAGNOSIS — I129 Hypertensive chronic kidney disease with stage 1 through stage 4 chronic kidney disease, or unspecified chronic kidney disease: Secondary | ICD-10-CM | POA: Diagnosis not present

## 2020-05-11 DIAGNOSIS — R69 Illness, unspecified: Secondary | ICD-10-CM | POA: Diagnosis not present

## 2020-05-11 DIAGNOSIS — R600 Localized edema: Secondary | ICD-10-CM | POA: Diagnosis not present

## 2020-05-11 DIAGNOSIS — N1831 Chronic kidney disease, stage 3a: Secondary | ICD-10-CM | POA: Diagnosis not present

## 2020-05-11 DIAGNOSIS — N390 Urinary tract infection, site not specified: Secondary | ICD-10-CM | POA: Diagnosis not present

## 2020-05-11 DIAGNOSIS — R531 Weakness: Secondary | ICD-10-CM | POA: Diagnosis not present

## 2020-05-31 DIAGNOSIS — R531 Weakness: Secondary | ICD-10-CM | POA: Diagnosis not present

## 2020-05-31 DIAGNOSIS — R627 Adult failure to thrive: Secondary | ICD-10-CM | POA: Diagnosis not present

## 2020-05-31 DIAGNOSIS — R69 Illness, unspecified: Secondary | ICD-10-CM | POA: Diagnosis not present

## 2020-05-31 DIAGNOSIS — R600 Localized edema: Secondary | ICD-10-CM | POA: Diagnosis not present

## 2020-07-01 DIAGNOSIS — R69 Illness, unspecified: Secondary | ICD-10-CM | POA: Diagnosis not present

## 2020-07-01 DIAGNOSIS — R600 Localized edema: Secondary | ICD-10-CM | POA: Diagnosis not present

## 2020-07-01 DIAGNOSIS — R531 Weakness: Secondary | ICD-10-CM | POA: Diagnosis not present

## 2020-07-01 DIAGNOSIS — R627 Adult failure to thrive: Secondary | ICD-10-CM | POA: Diagnosis not present

## 2020-07-13 DIAGNOSIS — M545 Low back pain, unspecified: Secondary | ICD-10-CM | POA: Diagnosis not present

## 2020-07-13 DIAGNOSIS — R3915 Urgency of urination: Secondary | ICD-10-CM | POA: Diagnosis not present

## 2020-07-13 DIAGNOSIS — R310 Gross hematuria: Secondary | ICD-10-CM | POA: Diagnosis not present

## 2020-07-13 DIAGNOSIS — Z87442 Personal history of urinary calculi: Secondary | ICD-10-CM | POA: Diagnosis not present

## 2020-07-17 ENCOUNTER — Telehealth: Payer: Self-pay

## 2020-07-18 DIAGNOSIS — M25551 Pain in right hip: Secondary | ICD-10-CM | POA: Diagnosis not present

## 2020-07-18 DIAGNOSIS — M545 Low back pain, unspecified: Secondary | ICD-10-CM | POA: Diagnosis not present

## 2020-07-23 ENCOUNTER — Telehealth: Payer: Self-pay

## 2020-07-23 NOTE — Telephone Encounter (Signed)
Phone call placed to patient's wife. She had not heard any of the X ray results as of yet. Contacted [employer] for results and to fu for billing. X ray results to be faxed again. Will follow up with Phineas Real NP

## 2020-07-24 NOTE — Telephone Encounter (Signed)
Updated patient's wife of X ray results after reviewing them with Palliative NP. X ray results faxed over to PCP. Follow up PC visit scheduled for 07/30/20

## 2020-07-30 ENCOUNTER — Other Ambulatory Visit: Payer: Medicare HMO | Admitting: Student

## 2020-07-30 ENCOUNTER — Other Ambulatory Visit: Payer: Self-pay

## 2020-07-30 DIAGNOSIS — F0281 Dementia in other diseases classified elsewhere with behavioral disturbance: Secondary | ICD-10-CM

## 2020-07-30 DIAGNOSIS — M25551 Pain in right hip: Secondary | ICD-10-CM

## 2020-07-30 DIAGNOSIS — R69 Illness, unspecified: Secondary | ICD-10-CM | POA: Diagnosis not present

## 2020-07-30 DIAGNOSIS — Z515 Encounter for palliative care: Secondary | ICD-10-CM

## 2020-07-30 DIAGNOSIS — G301 Alzheimer's disease with late onset: Secondary | ICD-10-CM | POA: Diagnosis not present

## 2020-07-30 NOTE — Progress Notes (Signed)
[facility] Pall[facility] Care Consult Note Telephone: [phone]  Fax: [phone]    Date of encounter: 07/30/20 PATIENT NAME: [place]w Ochoa [place] [id number]   [phone] (home)  DOB: 1933/02/17 MRN: [id number] PRIMARY CARE PROVIDER:    Lorn Ochoa  [place] [address] [phone]  REFERRING PROVIDER:   Sueanne Ochoa, [place],  [place] [address] [phone]  RESPONSIBLE PARTY:    Contact Information    Name Relation Home Work [place] Ochoa   Mathew Ochoa Daughter   [phone]   Mathew Ochoa   [phone]       I met face to face with patient and family in the home. Palliative Care was asked to follow this patient by consultation request of  Mathew Margarita, DO to address advance care planning and complex medical decision making. This is a follow up visit.                                   ASSESSMENT AND PLAN / RECOMMENDATIONS:   Advance Care Planning/Goals of Care: Goals include to maximize quality of life and symptom management. Our advance care planning conversation included a discussion about:     The value and importance of advance care planning   Experiences with loved ones who have been seriously ill or have died   Exploration of personal, cultural or spiritual beliefs that might influence medical decisions   Exploration of goals of care in the event of a sudden injury or illness   Identification and preparation of a healthcare agent   Review and updating or creation of an  advance directive document   CODE STATUS: Full Code  Symptom Management/Plan:  Late onset Alzheimer's dementia- FAST Score 6D. Continue to reorient/redirect as needed. Continue memantine 53m BID and depakote ER 2571mQHS. Monitor for behavioral changes. Monitor for falls/safety. Follow up with neurology as scheduled.   Right hip pain-x-ray  of right hip unremarkable; spine with multilevel degenerative disc disease. Continue acetaminophen 100021mID, may apply heat pack for up to 20 minutes QID PRN. Recommend lidocaine patch 4% apply to affected area, on 12 hours/off 12 hours.   Discussed patient receiving additional support in the home. [facility] [place] [name], [name]   Follow up Palliative Care Visit: Palliative care will continue to follow for complex medical decision making, advance care planning, and clarification of goals. Return in 8 weeks or prn.  I spent 60 minutes providing this consultation. More than 50% of the time in this consultation was spent in counseling and care coordination.   PPS: 50%  HOSPICE ELIGIBILITY/DIAGNOSIS: TBD  Chief Complaint: Palliative Medicine follow up visit; dementia.  HISTORY OF PRESENT ILLNESS:  Mathew Ochoa a 87 102o. year old male  with Late onset Alzheimer's dementia. Dx also include hyperlipidemia, neuropathy.   Recent complaints of right hip pain; no recent falls or injury. X-ray on 07/18/20 of spine results multilevel degenerative dis disease, right hip unremarkable. Wife states that pain has somewhat improved. He ambulates without assistive device. Requires assist with adl's. Wife reports behaviors have been managed. He is forgetful; sometimes does not recall who familiar people are. Patient prefers to stay in the home mostly, does not care for front door to be open. Patient states he does  not always sleep at night; wife states that he is always sleeping when she checks on him and he also naps during the day. Good appetite endorsed; no swallowing difficulty. Patient completed antibiotics in the past week for urinary tract infection.   History obtained from review of EMR, discussion with primary team, and interview with family, facility staff/caregiver and/or Mathew Ochoa.  I reviewed available labs,  medications, imaging, studies and related documents from the EMR.  Records reviewed and summarized above.   ROS  General: NAD EYES: denies vision changes ENMT: denies dysphagia Cardiovascular: denies chest pain Pulmonary: denies cough, denies increased SOB Abdomen: endorses good appetite, denies constipation GU: denies dysuria, urinary incontinence; wears pull ups MSK: weakness,  no falls reported Skin: denies rashes or wounds Neurological: denies pain Psych: Endorses positive mood Heme/lymph/immuno: denies bruises, abnormal bleeding  Physical Exam: Pulse 68, resp 16, b/p 110/60, sats 96% on room air.  Constitutional: NAD General: obese  EYES: anicteric sclera, lids intact, no discharge  ENMT: intact hearing, oral mucous membranes moist, dentition intact CV: S1S2, RRR, 1+ LE edema Pulmonary: LCTA, no increased work of breathing, no cough Abdomen: normo-active BS + 4 quadrants, soft and non tender GU: deferred MSK/spine: non pain or tenderness to right hip or spine with palpation, ambulatory with shuffled gait Skin: warm and dry, no rashes or wounds on visible skin Neuro: generalized weakness Psych: non-anxious affect Hem/lymph/immuno: no widespread bruising   Thank you for the opportunity to participate in the care of Mathew Ochoa.  The palliative care team will continue to follow. Please call our office at [phone] if we can be of additional assistance.   Mathew Boxer, NP   COVID-19 PATIENT SCREENING TOOL Asked and negative response unless otherwise noted:   Have you had symptoms of covid, tested positive or been in contact with someone with symptoms/positive test in the past 5-10 days? No

## 2020-07-31 DIAGNOSIS — R69 Illness, unspecified: Secondary | ICD-10-CM | POA: Diagnosis not present

## 2020-07-31 DIAGNOSIS — R531 Weakness: Secondary | ICD-10-CM | POA: Diagnosis not present

## 2020-07-31 DIAGNOSIS — R600 Localized edema: Secondary | ICD-10-CM | POA: Diagnosis not present

## 2020-07-31 DIAGNOSIS — R627 Adult failure to thrive: Secondary | ICD-10-CM | POA: Diagnosis not present

## 2020-08-14 DIAGNOSIS — R69 Illness, unspecified: Secondary | ICD-10-CM | POA: Diagnosis not present

## 2020-08-14 DIAGNOSIS — I240 Acute coronary thrombosis not resulting in myocardial infarction: Secondary | ICD-10-CM | POA: Diagnosis not present

## 2020-08-14 DIAGNOSIS — I129 Hypertensive chronic kidney disease with stage 1 through stage 4 chronic kidney disease, or unspecified chronic kidney disease: Secondary | ICD-10-CM | POA: Diagnosis not present

## 2020-08-14 DIAGNOSIS — R7301 Impaired fasting glucose: Secondary | ICD-10-CM | POA: Diagnosis not present

## 2020-08-14 DIAGNOSIS — N390 Urinary tract infection, site not specified: Secondary | ICD-10-CM | POA: Diagnosis not present

## 2020-08-14 DIAGNOSIS — N1831 Chronic kidney disease, stage 3a: Secondary | ICD-10-CM | POA: Diagnosis not present

## 2020-08-14 DIAGNOSIS — R531 Weakness: Secondary | ICD-10-CM | POA: Diagnosis not present

## 2020-08-14 DIAGNOSIS — R600 Localized edema: Secondary | ICD-10-CM | POA: Diagnosis not present

## 2020-08-31 DIAGNOSIS — R531 Weakness: Secondary | ICD-10-CM | POA: Diagnosis not present

## 2020-08-31 DIAGNOSIS — R627 Adult failure to thrive: Secondary | ICD-10-CM | POA: Diagnosis not present

## 2020-08-31 DIAGNOSIS — R600 Localized edema: Secondary | ICD-10-CM | POA: Diagnosis not present

## 2020-08-31 DIAGNOSIS — R69 Illness, unspecified: Secondary | ICD-10-CM | POA: Diagnosis not present

## 2020-09-01 ENCOUNTER — Ambulatory Visit: Payer: Medicare HMO | Admitting: Neurology

## 2020-09-24 DIAGNOSIS — E785 Hyperlipidemia, unspecified: Secondary | ICD-10-CM | POA: Diagnosis not present

## 2020-09-24 DIAGNOSIS — R69 Illness, unspecified: Secondary | ICD-10-CM | POA: Diagnosis not present

## 2020-09-24 DIAGNOSIS — I129 Hypertensive chronic kidney disease with stage 1 through stage 4 chronic kidney disease, or unspecified chronic kidney disease: Secondary | ICD-10-CM | POA: Diagnosis not present

## 2020-09-24 DIAGNOSIS — N1831 Chronic kidney disease, stage 3a: Secondary | ICD-10-CM | POA: Diagnosis not present

## 2020-09-30 DIAGNOSIS — R600 Localized edema: Secondary | ICD-10-CM | POA: Diagnosis not present

## 2020-09-30 DIAGNOSIS — R627 Adult failure to thrive: Secondary | ICD-10-CM | POA: Diagnosis not present

## 2020-09-30 DIAGNOSIS — R531 Weakness: Secondary | ICD-10-CM | POA: Diagnosis not present

## 2020-09-30 DIAGNOSIS — R69 Illness, unspecified: Secondary | ICD-10-CM | POA: Diagnosis not present

## 2020-10-01 ENCOUNTER — Other Ambulatory Visit: Payer: Medicare HMO

## 2020-10-05 ENCOUNTER — Other Ambulatory Visit: Payer: Self-pay

## 2020-10-05 ENCOUNTER — Other Ambulatory Visit: Payer: Medicare HMO | Admitting: *Deleted

## 2020-10-05 ENCOUNTER — Other Ambulatory Visit: Payer: Medicare HMO

## 2020-10-05 VITALS — BP 160/80 | HR 60 | Temp 97.7°F | Resp 16

## 2020-10-05 DIAGNOSIS — Z515 Encounter for palliative care: Secondary | ICD-10-CM

## 2020-10-05 NOTE — Progress Notes (Signed)
COMMUNITY PALLIATIVE CARE SW NOTE  PATIENT NAME: Mathew Ochoa DOB: [birth year] MRN: [id number]  PRIMARY CARE PROVIDER: Sueanne Margarita, DO  RESPONSIBLE PARTY:  Acct ID - Guarantor Home Phone Work Phone Relationship Acct Type  [id number] - Delehanty,BOB* [phone]  Self P/F     [address], [place], [place] [address]     PLAN OF CARE and INTERVENTIONS:             GOALS OF CARE/ ADVANCE CARE PLANNING: Goal is to keep patient in his home. Patient is a DNR. SOCIAL/EMOTIONAL/SPIRITUAL ASSESSMENT/ INTERVENTIONS:  SW and RN- M. Nadara Mustard completed a follow-up visit with patient to assess his needs, comfort and provide support to his wife-Susan. Manuela Schwartz provided a status update on patient. Patient is having increased shakes and was recently diagnosed with Parkinson's. Manuela Schwartz reported that patient has reported that it is hard for him to swallow, he doesn't remember where he is, having intermittent dizziness and sleeping later in the mornings (7:30 pm-11:00 pm). Patient is having auditory and visual hallucinations, where he see and hears people in the home. Patient also displays some anger with personal care. Patient is also afraid to go out on the front porch. Manuela Schwartz state that patient is able to go out to worship service on Sundays, but ca only stay for about an hour. He has incontinent episodes. Patient ambulates independently with a cane, but is pushed in a wheelchair when he is out. Patient and his wife have support from their son, who runs some errands, complete yard work and sit with patient when Manuela Schwartz has to go out. Manuela Schwartz stated that she would like to have additional help wit patient's showers. SW advised her to reach out to the [place] as they may have additional services available. Manuela Schwartz stated the she was given and application for the services-SW reviewed the application with her, clarifying what information was needed. The RN/SW team was also able to assist her with getting a refill on patient's medication  through the [place]. The medication will be overnight ed to patient tomorrow. SW provided supportive presence , active listening, education, assessment of needs, comfort of patient and coping of PCG, and provided reassurance of support. SW to provide education, resources and support as needed.  PATIENT/CAREGIVER EDUCATION/ COPING:  Patient and his wife is coping well.  PERSONAL EMERGENCY PLAN:  911 can be accessed for emergencies.  COMMUNITY RESOURCES COORDINATION/ HEALTH CARE NAVIGATION:  Patient receives health services and pharmacy through the [place]. His wife is seeking additional services through the [place] for personal care.  FINANCIAL/LEGAL CONCERNS/INTERVENTIONS:  None.     SOCIAL HX:  Social History   Tobacco Use   Smoking status: Never   Smokeless tobacco: Never  Substance Use Topics   Alcohol use: No    CODE STATUS: DNR ADVANCED DIRECTIVES: Yes MOST FORM COMPLETE: Yes HOSPICE EDUCATION PROVIDED: Hospice education provided.  PPS: Patient ambulates with his cane and  his wheelchair when he is out. Patient has intermittent hallucinations and agitation with personal care.   Duration of visit and documentation: 60 minutes.    [address] [place], [place]

## 2020-10-06 NOTE — Progress Notes (Signed)
[facility] COMMUNITY PALLIATIVE CARE RN NOTE  PATIENT NAME: Mathew Ochoa DOB: April 14, 1932 MRN: [id number]  PRIMARY CARE PROVIDER: Sueanne Margarita, DO  RESPONSIBLE PARTY: Sandi Raveling (wife) Acct ID - Guarantor Home Phone Work Phone Relationship Acct Type  [id number] HARIS, [phone]  Self P/F     [address] [place],  [address]   Covid-19 Pre-screening Negative  PLAN OF CARE and INTERVENTION:  ADVANCE CARE PLANNING/GOALS OF CARE: Wife says her goal is to keep patient at home as long as possible. He has a DNR and a MOST form.  PATIENT/CAREGIVER EDUCATION: Symptom management, how to manage behaviors, safe mobility/transfers, what to expect as disease progresses DISEASE STATUS: Joint face-to-face visit completed with palliative care MSW, [facility]. Met with patient and his wife in their home. Wife provided patient update prior to my physical assessment. She states that patient is experiencing increased confusion. He also has started with visual and auditory hallucinations. He is hearing voices in the home and stated that he saw something crawling on the wall. He does become agitated at times. He is still able to recognize his wife and son. Sometimes he does not recognize his home. Wife states he was just recently seen by a Neurologist at the [place] who gave patient a new diagnosis of Parkinson's disease. He has already been diagnosed with Alzheimer's dementia. She continues to try and take patient to church services on Sunday. Last Sunday, he had difficulty figuring out how to get into the car, however this past Sunday he got in without difficulty. He is ambulatory, but has a shuffled gait. Wife says some days he can barely walk. He is a high fall risk. Most of the time his wife has to assist him with bathing, dressing and toileting, but last night he went to bed in his pajamas, but woke up during the night at 1a and 3a, and by 5a he was fully dressed stating that he needed to leave.  His appetite is good. Often times he doesn't remember eating. He requires assistance during meals occasionally due to bilateral hand tremors. He is starting to have difficulty swallowing. Wife states he coughs some during meals and at other times notices him coughing without eating. Recommended giving patient a soft diet to make swallowing easier. He is taking his medications whole with water without difficulty. He is intermittently incontinent of both bowel and bladder and wears adult pull ups. During visit today, patient was pleasant and engaging. He is forgetful, but able to speak in complete sentences. He denies pain or discomfort. No shortness of breath noted. Wife requested assistance on getting his Namenda refilled through the [place]. MSW contacted the [place] and they are to send this medication via overnight mail tomorrow. Wife is Patent attorney. Will continue to monitor.   CODE STATUS: DNR ADVANCED DIRECTIVES: Y MOST FORM: yes PPS: 40%   PHYSICAL EXAM:   VITALS: Today's Vitals   10/05/20 1436  BP: (!) 160/80  Pulse: 60  Resp: 16  Temp: 97.7 F (36.5 C)  TempSrc: Temporal  SpO2: 96%  PainSc: 0-No pain    (Duration of visit and documentation 75 minutes)  Daryl Eastern, RN BSN

## 2020-10-25 DIAGNOSIS — I129 Hypertensive chronic kidney disease with stage 1 through stage 4 chronic kidney disease, or unspecified chronic kidney disease: Secondary | ICD-10-CM | POA: Diagnosis not present

## 2020-10-25 DIAGNOSIS — E785 Hyperlipidemia, unspecified: Secondary | ICD-10-CM | POA: Diagnosis not present

## 2020-10-25 DIAGNOSIS — E1122 Type 2 diabetes mellitus with diabetic chronic kidney disease: Secondary | ICD-10-CM | POA: Diagnosis not present

## 2020-10-25 DIAGNOSIS — N1831 Chronic kidney disease, stage 3a: Secondary | ICD-10-CM | POA: Diagnosis not present

## 2020-10-31 DIAGNOSIS — R69 Illness, unspecified: Secondary | ICD-10-CM | POA: Diagnosis not present

## 2020-10-31 DIAGNOSIS — R627 Adult failure to thrive: Secondary | ICD-10-CM | POA: Diagnosis not present

## 2020-10-31 DIAGNOSIS — R600 Localized edema: Secondary | ICD-10-CM | POA: Diagnosis not present

## 2020-10-31 DIAGNOSIS — R531 Weakness: Secondary | ICD-10-CM | POA: Diagnosis not present

## 2020-12-01 DIAGNOSIS — R600 Localized edema: Secondary | ICD-10-CM | POA: Diagnosis not present

## 2020-12-01 DIAGNOSIS — R531 Weakness: Secondary | ICD-10-CM | POA: Diagnosis not present

## 2020-12-01 DIAGNOSIS — R627 Adult failure to thrive: Secondary | ICD-10-CM | POA: Diagnosis not present

## 2020-12-01 DIAGNOSIS — R69 Illness, unspecified: Secondary | ICD-10-CM | POA: Diagnosis not present

## 2020-12-07 ENCOUNTER — Telehealth: Payer: Self-pay

## 2020-12-07 NOTE — Telephone Encounter (Signed)
1032 a.  Phone call made to wife to follow up on patient's condition.  Wife reports patient is better today.  She notes ongoing functional limitations as patient's ability to ambulate has decreased.  Patient continues with confusion and some days are worse than others.  Often needing cues as to next steps.  Examples wife provides is patient needs cues to pick up the fork to feed himself or when to toilet.  Wife notes patient has Parkinson's and Dementia.  She is checking patient's vital signs regularly and they are normal.  Wife is providing 24/7 care.  She would like a follow up visit with a Palliative Care provider but is unable to do 12/16/20 as [facility].  Advised that I would have our scheduler contact her with available dates.   Maxwell Caul, RN advised of call back need to schedule an appt.

## 2020-12-09 ENCOUNTER — Other Ambulatory Visit: Payer: Self-pay

## 2020-12-09 ENCOUNTER — Other Ambulatory Visit: Payer: Medicare HMO | Admitting: Student

## 2020-12-09 DIAGNOSIS — F02818 Dementia in other diseases classified elsewhere, unspecified severity, with other behavioral disturbance: Secondary | ICD-10-CM

## 2020-12-09 DIAGNOSIS — Z515 Encounter for palliative care: Secondary | ICD-10-CM

## 2020-12-09 DIAGNOSIS — G20 Parkinson's disease: Secondary | ICD-10-CM | POA: Diagnosis not present

## 2020-12-09 DIAGNOSIS — R600 Localized edema: Secondary | ICD-10-CM

## 2020-12-09 DIAGNOSIS — F0281 Dementia in other diseases classified elsewhere with behavioral disturbance: Secondary | ICD-10-CM

## 2020-12-09 DIAGNOSIS — G301 Alzheimer's disease with late onset: Secondary | ICD-10-CM | POA: Diagnosis not present

## 2020-12-09 DIAGNOSIS — R69 Illness, unspecified: Secondary | ICD-10-CM | POA: Diagnosis not present

## 2020-12-09 NOTE — Progress Notes (Signed)
[facility] Palliative Care Consult Note Telephone: [phone]  Fax: [phone]    Date of encounter: 12/09/20 1:47 PM PATIENT NAME: Mathew Ochoa [address] [place] [address]   [phone] (home)  DOB: 04-Jun-1932 MRN: [id number] PRIMARY CARE PROVIDER:    [name]  [place] [address] [phone]  REFERRING PROVIDER:   Sueanne Margarita, [address] [place],  [place] [address] [phone]  RESPONSIBLE PARTY:    Contact Information     Name Relation Home Work [place] [phone]  [phone]   [name] Daughter   [phone]   Jariel, Drost   [phone]        I met face to face with patient and family in the home. Palliative Care was asked to follow this patient by consultation request of  Sueanne Margarita, DO to address advance care planning and complex medical decision making. This is a follow up visit.                                   ASSESSMENT AND PLAN / RECOMMENDATIONS:   Advance Care Planning/Goals of Care: Goals include to maximize quality of life and symptom management.   CODE STATUS: Full Code  Symptom Management/Plan:  Late onset Alzheimer's dementia- FAST Score 6D. Patient is having a functional , cognitive decline. He waxes and wanes. He is needing additional support in the home. Requires much cueing and redirection. Continue to reorient/redirect as needed. Continue memantine and Depakote as directed. Monitor for behavioral changes. Monitor for falls/safety. Encourage use of rollator walker for ambulation. Education on dementia provided; wife and family will need ongoing support and education. Follow up with neurology as scheduled.   Palliative SW notified regarding patient's wife needing assistance with [facility]. Wife states patient's application for aid was declined, but she also states they mentioned a co-pay. She does not have anything in writing. We did  call and leave message for patient's [facility] SW.   Parkinson's disease-patient was unable to tolerate taking sinemet; caused increased agitation per family. Patient has upcoming appointment with neurology; wife wants to discuss alternatives.   Lower extremity Edema-secondary to his hypertensive chronic kidney disease. Take furosemide daily instead of every other day. Encourage patient to elevate legs while sitting or in bed.   Follow up Palliative Care Visit: Palliative care will continue to follow for complex medical decision making, advance care planning, and clarification of goals. Return in 8 weeks or prn.  I spent 60 minutes providing this consultation. More than 50% of the time in this consultation was spent in counseling and care coordination.    PPS: 40%  HOSPICE ELIGIBILITY/DIAGNOSIS: TBD  Chief Complaint: Palliative Medicine follow up visit.    HISTORY OF PRESENT ILLNESS:  Mathew Ochoa is a 87 y.o. year old male  with  Late onset Alzheimer's dementia, Parkinson's disease, hypertensive chronic kidney disease, hyperlipidemia, neuropathy, glaucoma.   Patient resides at home with wife. She reports patient having days where he requires much more assistance, and other days he is better. Some days he can get out of bed without difficulty; at times he has difficulty with placing his legs on the floor. He has a Teaching laboratory technician; wife states he did well with it for a couple of days. He is having more episodes where he is toileting inappropriately. He denies pain, shortness of breath, nausea, constipation.  He does have worsening edema; he is taking furosemide every other day. His appetite remains good. He is sleeping okay; naps intermittently during the day. Wife expresses needing additional support No recent ER visits or hospitalizations. HPI and ROS primarily obtained from wife and daughter. Patient is able to answer some direct yes/no questions. A 10-point review of systems is negative, except  for the pertinent positives and negatives detailed in the HPI.  History obtained from review of EMR, discussion with primary team, and interview with family, facility staff/caregiver and/or [name].  I reviewed available labs, medications, imaging, studies and related documents from the EMR.  Records reviewed and summarized above.   Physical Exam:  Pulse 64, resp 18, 136/78, 97% on room air Weight:  229 pounds Constitutional: NAD General: frail appearing  EYES: anicteric sclera, lids intact, no discharge  ENMT: intact hearing, oral mucous membranes moist, dentition intact CV: S1S2, RRR, 2+ non pitting pedal edema Pulmonary: LCTA, no increased work of breathing, no cough, room air Abdomen:  normo-active BS + 4 quadrants, soft and non tender GU: deferred MSK: no sarcopenia, moves all extremities, ambulatory with shuffled gait Skin: warm and dry, no rashes or wounds on visible skin Neuro: generalized weakness, A & O to person, familiars  Psych: non-anxious affect, pleasant Hem/lymph/immuno: no widespread bruising   Thank you for the opportunity to participate in the care of [name].  The palliative care team will continue to follow. Please call our office at [phone] if we can be of additional assistance.   Ezekiel Slocumb, NP   COVID-19 PATIENT SCREENING TOOL Asked and negative response unless otherwise noted:   Have you had symptoms of covid, tested positive or been in contact with someone with symptoms/positive test in the past 5-10 days? No

## 2020-12-25 DIAGNOSIS — I129 Hypertensive chronic kidney disease with stage 1 through stage 4 chronic kidney disease, or unspecified chronic kidney disease: Secondary | ICD-10-CM | POA: Diagnosis not present

## 2020-12-25 DIAGNOSIS — E785 Hyperlipidemia, unspecified: Secondary | ICD-10-CM | POA: Diagnosis not present

## 2020-12-25 DIAGNOSIS — N1831 Chronic kidney disease, stage 3a: Secondary | ICD-10-CM | POA: Diagnosis not present

## 2020-12-31 DIAGNOSIS — R69 Illness, unspecified: Secondary | ICD-10-CM | POA: Diagnosis not present

## 2020-12-31 DIAGNOSIS — R600 Localized edema: Secondary | ICD-10-CM | POA: Diagnosis not present

## 2020-12-31 DIAGNOSIS — R627 Adult failure to thrive: Secondary | ICD-10-CM | POA: Diagnosis not present

## 2020-12-31 DIAGNOSIS — R531 Weakness: Secondary | ICD-10-CM | POA: Diagnosis not present

## 2021-01-05 DIAGNOSIS — R7301 Impaired fasting glucose: Secondary | ICD-10-CM | POA: Diagnosis not present

## 2021-01-05 DIAGNOSIS — E559 Vitamin D deficiency, unspecified: Secondary | ICD-10-CM | POA: Diagnosis not present

## 2021-01-05 DIAGNOSIS — I10 Essential (primary) hypertension: Secondary | ICD-10-CM | POA: Diagnosis not present

## 2021-01-05 DIAGNOSIS — E785 Hyperlipidemia, unspecified: Secondary | ICD-10-CM | POA: Diagnosis not present

## 2021-01-05 DIAGNOSIS — Z125 Encounter for screening for malignant neoplasm of prostate: Secondary | ICD-10-CM | POA: Diagnosis not present

## 2021-01-12 DIAGNOSIS — I129 Hypertensive chronic kidney disease with stage 1 through stage 4 chronic kidney disease, or unspecified chronic kidney disease: Secondary | ICD-10-CM | POA: Diagnosis not present

## 2021-01-12 DIAGNOSIS — I5032 Chronic diastolic (congestive) heart failure: Secondary | ICD-10-CM | POA: Diagnosis not present

## 2021-01-12 DIAGNOSIS — Z0000 Encounter for general adult medical examination without abnormal findings: Secondary | ICD-10-CM | POA: Diagnosis not present

## 2021-01-12 DIAGNOSIS — Z1339 Encounter for screening examination for other mental health and behavioral disorders: Secondary | ICD-10-CM | POA: Diagnosis not present

## 2021-01-12 DIAGNOSIS — I240 Acute coronary thrombosis not resulting in myocardial infarction: Secondary | ICD-10-CM | POA: Diagnosis not present

## 2021-01-12 DIAGNOSIS — R69 Illness, unspecified: Secondary | ICD-10-CM | POA: Diagnosis not present

## 2021-01-12 DIAGNOSIS — Z23 Encounter for immunization: Secondary | ICD-10-CM | POA: Diagnosis not present

## 2021-01-12 DIAGNOSIS — F03918 Unspecified dementia, unspecified severity, with other behavioral disturbance: Secondary | ICD-10-CM | POA: Diagnosis not present

## 2021-01-12 DIAGNOSIS — Z515 Encounter for palliative care: Secondary | ICD-10-CM | POA: Diagnosis not present

## 2021-01-12 DIAGNOSIS — R600 Localized edema: Secondary | ICD-10-CM | POA: Diagnosis not present

## 2021-01-12 DIAGNOSIS — R7989 Other specified abnormal findings of blood chemistry: Secondary | ICD-10-CM | POA: Diagnosis not present

## 2021-01-12 DIAGNOSIS — R531 Weakness: Secondary | ICD-10-CM | POA: Diagnosis not present

## 2021-01-12 DIAGNOSIS — Z1331 Encounter for screening for depression: Secondary | ICD-10-CM | POA: Diagnosis not present

## 2021-01-12 DIAGNOSIS — N1831 Chronic kidney disease, stage 3a: Secondary | ICD-10-CM | POA: Diagnosis not present

## 2021-01-12 DIAGNOSIS — G4733 Obstructive sleep apnea (adult) (pediatric): Secondary | ICD-10-CM | POA: Diagnosis not present

## 2021-01-13 ENCOUNTER — Telehealth: Payer: Self-pay

## 2021-01-13 NOTE — Telephone Encounter (Signed)
Received message that patient's PCP is referring patient to hospice services. Phone call placed to patient's wife to follow up. Unable to leave VM. Home phone is busy.

## 2021-01-14 ENCOUNTER — Telehealth: Payer: Self-pay

## 2021-01-14 NOTE — Telephone Encounter (Signed)
TC placed to home and mobile numbers. Home line continues to be busy. Mobile phone does not have VM set up.

## 2021-01-20 ENCOUNTER — Telehealth: Payer: Self-pay

## 2021-01-20 NOTE — Telephone Encounter (Signed)
Discussed patient's decline with Dr. Konrad Dolores who approved sending referral to Hospice. Manuela Schwartz updated and provided education on what to expect with hospice evaluation. Encouraged Manuela Schwartz to call with any further questons or concerns.

## 2021-01-20 NOTE — Telephone Encounter (Signed)
Phone call placed to patient's daughter to f/u with  PCP recommendation for patient to transition to hospice services. Daughter provided updated phone number to contact patient's wife.

## 2021-01-20 NOTE — Telephone Encounter (Signed)
Spoke with patient's wife, Manuela Schwartz, who provided update on observed changes that patient has demonstrated. Manuela Schwartz shared that patient is having worsening confusion and hallucinations. Patient now has times where he is not able to recognize his home or his family. Patient has increased weakness and subsequent functional decline. He is now staying in bed most of the time. He will occasionally attempt to get out of bed and ambulate. He has a slow shuffling gait and legs give out and he will land on the floor. He has decreased interest in food and now requires his wife to provide constant direction and encouragement to eat. Manuela Schwartz shared that PCP recently examined patient and feels he would benefit from Hospice services. Manuela Schwartz also stated that she was told that the PCP feels that the patient is declining at a faster rate than first anticipated and provided prognosis of 6 months. Will discuss with Dr.Mongoilod for further direction and f/u with Manuela Schwartz

## 2021-02-12 DIAGNOSIS — F03918 Unspecified dementia, unspecified severity, with other behavioral disturbance: Secondary | ICD-10-CM | POA: Diagnosis not present

## 2021-02-12 DIAGNOSIS — Z515 Encounter for palliative care: Secondary | ICD-10-CM | POA: Diagnosis not present

## 2021-02-12 DIAGNOSIS — I129 Hypertensive chronic kidney disease with stage 1 through stage 4 chronic kidney disease, or unspecified chronic kidney disease: Secondary | ICD-10-CM | POA: Diagnosis not present

## 2021-02-12 DIAGNOSIS — R531 Weakness: Secondary | ICD-10-CM | POA: Diagnosis not present

## 2021-02-12 DIAGNOSIS — R7989 Other specified abnormal findings of blood chemistry: Secondary | ICD-10-CM | POA: Diagnosis not present

## 2021-02-12 DIAGNOSIS — R600 Localized edema: Secondary | ICD-10-CM | POA: Diagnosis not present

## 2021-02-12 DIAGNOSIS — N1831 Chronic kidney disease, stage 3a: Secondary | ICD-10-CM | POA: Diagnosis not present

## 2021-02-12 DIAGNOSIS — R69 Illness, unspecified: Secondary | ICD-10-CM | POA: Diagnosis not present

## 2021-02-12 DIAGNOSIS — I5032 Chronic diastolic (congestive) heart failure: Secondary | ICD-10-CM | POA: Diagnosis not present

## 2021-02-12 DIAGNOSIS — G4733 Obstructive sleep apnea (adult) (pediatric): Secondary | ICD-10-CM | POA: Diagnosis not present

## 2021-03-30 ENCOUNTER — Emergency Department (HOSPITAL_COMMUNITY)

## 2021-03-30 ENCOUNTER — Emergency Department (HOSPITAL_COMMUNITY)
Admission: EM | Admit: 2021-03-30 | Discharge: 2021-03-31 | Disposition: A | Discharge status: Home / Self Care | Attending: Emergency Medicine | Admitting: Emergency Medicine

## 2021-03-30 DIAGNOSIS — R29810 Facial weakness: Secondary | ICD-10-CM | POA: Diagnosis present

## 2021-03-30 DIAGNOSIS — Z20822 Contact with and (suspected) exposure to covid-19: Secondary | ICD-10-CM | POA: Diagnosis not present

## 2021-03-30 DIAGNOSIS — I129 Hypertensive chronic kidney disease with stage 1 through stage 4 chronic kidney disease, or unspecified chronic kidney disease: Secondary | ICD-10-CM | POA: Diagnosis not present

## 2021-03-30 DIAGNOSIS — Z7982 Long term (current) use of aspirin: Secondary | ICD-10-CM | POA: Diagnosis not present

## 2021-03-30 DIAGNOSIS — R059 Cough, unspecified: Secondary | ICD-10-CM | POA: Diagnosis not present

## 2021-03-30 DIAGNOSIS — N189 Chronic kidney disease, unspecified: Secondary | ICD-10-CM | POA: Diagnosis not present

## 2021-03-30 DIAGNOSIS — I639 Cerebral infarction, unspecified: Secondary | ICD-10-CM | POA: Diagnosis not present

## 2021-03-30 DIAGNOSIS — Z79899 Other long term (current) drug therapy: Secondary | ICD-10-CM | POA: Insufficient documentation

## 2021-03-30 DIAGNOSIS — Z743 Need for continuous supervision: Secondary | ICD-10-CM | POA: Diagnosis not present

## 2021-03-30 DIAGNOSIS — R000 Tachycardia, unspecified: Secondary | ICD-10-CM | POA: Diagnosis not present

## 2021-03-30 DIAGNOSIS — R4781 Slurred speech: Secondary | ICD-10-CM | POA: Diagnosis present

## 2021-03-30 DIAGNOSIS — R404 Transient alteration of awareness: Secondary | ICD-10-CM | POA: Diagnosis not present

## 2021-03-30 DIAGNOSIS — J189 Pneumonia, unspecified organism: Secondary | ICD-10-CM | POA: Diagnosis not present

## 2021-03-30 DIAGNOSIS — J188 Other pneumonia, unspecified organism: Secondary | ICD-10-CM | POA: Insufficient documentation

## 2021-03-30 DIAGNOSIS — R410 Disorientation, unspecified: Secondary | ICD-10-CM | POA: Diagnosis not present

## 2021-03-30 DIAGNOSIS — R509 Fever, unspecified: Secondary | ICD-10-CM | POA: Diagnosis not present

## 2021-03-30 LAB — DIFFERENTIAL
Abs Immature Granulocytes: 0.03 10*3/uL (ref 0.00–0.07)
Basophils Absolute: 0.1 10*3/uL (ref 0.0–0.1)
Basophils Relative: 1 %
Eosinophils Absolute: 0.1 10*3/uL (ref 0.0–0.5)
Eosinophils Relative: 1 %
Immature Granulocytes: 0 %
Lymphocytes Relative: 21 %
Lymphs Abs: 2.1 10*3/uL (ref 0.7–4.0)
Monocytes Absolute: 1.1 10*3/uL — ABNORMAL HIGH (ref 0.1–1.0)
Monocytes Relative: 11 %
Neutro Abs: 6.8 10*3/uL (ref 1.7–7.7)
Neutrophils Relative %: 66 %

## 2021-03-30 LAB — COMPREHENSIVE METABOLIC PANEL
ALT: 23 U/L (ref 0–44)
AST: 31 U/L (ref 15–41)
Albumin: 3.5 g/dL (ref 3.5–5.0)
Alkaline Phosphatase: 56 U/L (ref 38–126)
Anion gap: 12 (ref 5–15)
BUN: 22 mg/dL (ref 8–23)
CO2: 22 mmol/L (ref 22–32)
Calcium: 10.1 mg/dL (ref 8.9–10.3)
Chloride: 102 mmol/L (ref 98–111)
Creatinine, Ser: 1.27 mg/dL — ABNORMAL HIGH (ref 0.61–1.24)
GFR, Estimated: 55 mL/min — ABNORMAL LOW (ref >60)
Glucose, Bld: 142 mg/dL — ABNORMAL HIGH (ref 70–99)
Potassium: 4.6 mmol/L (ref 3.5–5.1)
Sodium: 136 mmol/L (ref 135–145)
Total Bilirubin: 0.8 mg/dL (ref 0.3–1.2)
Total Protein: 7.3 g/dL (ref 6.5–8.1)

## 2021-03-30 LAB — CBC
HCT: 48.9 % (ref 39.0–52.0)
Hemoglobin: 16.3 g/dL (ref 13.0–17.0)
MCH: 29.9 pg (ref 26.0–34.0)
MCHC: 33.3 g/dL (ref 30.0–36.0)
MCV: 89.7 fL (ref 80.0–100.0)
Platelets: 238 10*3/uL (ref 150–400)
RBC: 5.45 MIL/uL (ref 4.22–5.81)
RDW: 13.2 % (ref 11.5–15.5)
WBC: 10.1 10*3/uL (ref 4.0–10.5)
nRBC: 0 % (ref 0.0–0.2)

## 2021-03-30 LAB — I-STAT CHEM 8, ED
BUN: 24 mg/dL — ABNORMAL HIGH (ref 8–23)
Calcium, Ion: 1.2 mmol/L (ref 1.15–1.40)
Chloride: 104 mmol/L (ref 98–111)
Creatinine, Ser: 1 mg/dL (ref 0.61–1.24)
Glucose, Bld: 139 mg/dL — ABNORMAL HIGH (ref 70–99)
HCT: 49 % (ref 39.0–52.0)
Hemoglobin: 16.7 g/dL (ref 13.0–17.0)
Potassium: 4.5 mmol/L (ref 3.5–5.1)
Sodium: 136 mmol/L (ref 135–145)
TCO2: 23 mmol/L (ref 22–32)

## 2021-03-30 LAB — PROTIME-INR
INR: 1 (ref 0.8–1.2)
Prothrombin Time: 12.8 seconds (ref 11.4–15.2)

## 2021-03-30 LAB — RESP PANEL BY RT-PCR (FLU A&B, COVID) ARPGX2
Influenza A by PCR: NEGATIVE
Influenza B by PCR: NEGATIVE
SARS Coronavirus 2 by RT PCR: NEGATIVE

## 2021-03-30 LAB — VALPROIC ACID LEVEL: Valproic Acid Lvl: <10 ug/mL — ABNORMAL LOW (ref 50.0–100.0)

## 2021-03-30 LAB — ETHANOL: Alcohol, Ethyl (B): <10 mg/dL (ref <10)

## 2021-03-30 LAB — CBG MONITORING, ED: Glucose-Capillary: 140 mg/dL — ABNORMAL HIGH (ref 70–99)

## 2021-03-30 LAB — APTT: aPTT: 28 seconds (ref 24–36)

## 2021-03-30 LAB — PHENOBARBITAL LEVEL: Phenobarbital: 7 ug/mL — ABNORMAL LOW (ref 15.0–30.0)

## 2021-03-30 MED ORDER — SODIUM CHLORIDE 0.9 % IV SOLN
1.0000 g | Freq: Once | INTRAVENOUS | Status: AC
  Administered 2021-03-30: 1 g via INTRAVENOUS
  Filled 2021-03-30: qty 10

## 2021-03-30 MED ORDER — LACTATED RINGERS IV BOLUS
1000.0000 mL | Freq: Once | INTRAVENOUS | Status: AC
  Administered 2021-03-30: 1000 mL via INTRAVENOUS

## 2021-03-30 MED ORDER — DOXYCYCLINE HYCLATE 100 MG PO CAPS: 100.0000 mg | ORAL_CAPSULE | Freq: Two times a day (BID) | ORAL | 0 refills | Status: AC

## 2021-03-30 MED ORDER — LORAZEPAM 2 MG/ML IJ SOLN
0.5000 mg | Freq: Once | INTRAMUSCULAR | Status: AC
  Administered 2021-03-30: 0.5 mg via INTRAVENOUS
  Filled 2021-03-30: qty 1

## 2021-03-30 MED ORDER — AMOXICILLIN-POT CLAVULANATE 875-125 MG PO TABS: 1.0000 | ORAL_TABLET | Freq: Two times a day (BID) | ORAL | 0 refills | Status: AC

## 2021-03-30 MED ORDER — DOXYCYCLINE HYCLATE 100 MG PO TABS
100.0000 mg | ORAL_TABLET | Freq: Once | ORAL | Status: AC
  Administered 2021-03-30: 100 mg via ORAL
  Filled 2021-03-30: qty 1

## 2021-03-30 NOTE — ED Notes (Signed)
Called [facility] to transport patient to home address. Patient has a few ahead of him.

## 2021-03-30 NOTE — Discharge Instructions (Signed)
It does appear that he has a pneumonia today which is most likely because he is not swallowing well.  He had a moment mild temperature as well which may be why he has been a little more agitated lately.  It is also probably due to the fact that he has not been on the phenobarbital.  I would start back the phenobarbital every 12 hours and then you can use the quetiapine every 6 hours as needed.  If he appears to start having any trouble breathing, vomiting or other concerns you can always call hospice to see if they can get him oxygen and other things to help him to be comfortable.  However if that is not working do not hesitate to return to the emergency room.

## 2021-03-30 NOTE — ED Triage Notes (Addendum)
Pt arrived via [facility] for cc of code stroke from home. Pt baseline A&Ox1 to self, advanced parkinson's, dementia. LSK @1800 . Staff slurred speech, LSFD @ 1830.18g x2 LFA, LW

## 2021-03-30 NOTE — ED Notes (Signed)
Brief changed ?

## 2021-03-30 NOTE — ED Provider Notes (Signed)
[facility] EMERGENCY DEPARTMENT Provider Note   CSN: [id number] Arrival date & time: 03/30/21  1927     History  Chief Complaint  Patient presents with   Code Stroke    Mathew Ochoa is a 88 y.o. male.  Patient is an 88 year old male with a history of hypertension, CKD, peripheral neuropathy, demyelinating disorder, dementia, glaucoma and peers to have been placed on hospice in October who is presenting today from his home as a code stroke.  Patient's wife gave the history and EMS reported that she noticed this morning when the patient woke up he did have some slurred speech that completely resolved and then around 1800 this evening patient developed right-sided facial droop and slurred speech and she called 911.  Wife did report to paramedics that patient was bedbound and did have significant dementia but this was not his baseline.  She is not currently present to give further history.  Per EMS patient's blood sugar was within normal limits.  Blood pressure has been stable.  Patient is awake and denies having any pain anywhere.  He will follow commands but does not give much further history.  The history is provided by the EMS personnel. The history is limited by the absence of a caregiver.      Home Medications Prior to Admission medications   Medication Sig Start Date End Date Taking? Authorizing Provider  acetaminophen (TYLENOL) 500 MG tablet Take 1,000 mg by mouth 2 (two) times daily.    [provider]  aspirin EC 81 MG tablet Take 81 mg by mouth daily.     [provider]  atorvastatin (LIPITOR) 20 MG tablet Take 20 mg by mouth daily.    [provider]  Brinzolamide-Brimonidine [facility]) 1-0.2 % SUSP Apply to eye. 1 drop into right eye    [provider]  Cholecalciferol (VITAMIN D3) 1000 UNITS CAPS Take 3,000 Units by mouth daily.     [provider]  divalproex (DEPAKOTE ER) 250 MG 24 hr tablet Take 1 tablet  every night 01/17/20   Cameron Sprang, MD  escitalopram (LEXAPRO) 10 MG tablet Take 1 tablet (10 mg total) by mouth daily. 01/17/20   Cameron Sprang, MD  furosemide (LASIX) 40 MG tablet Take 40 mg by mouth.    [provider]  latanoprost (XALATAN) 0.005 % ophthalmic solution Place 1 drop into both eyes at bedtime. 02/20/19   [provider]  lisinopril (ZESTRIL) 10 MG tablet Take 10 mg by mouth daily.    [provider]  Melatonin 1 MG CAPS Take by mouth.    [provider]  memantine (NAMENDA) 10 MG tablet Take 1 tablet twice a day 01/17/20   Cameron Sprang, MD  Netarsudil-Latanoprost (ROCKLATAN) 0.02-0.005 % SOLN Place 1 drop into both eyes at bedtime.    [provider]  Omega-3 Fatty Acids (FISH OIL) 1000 MG CAPS Take 1,000 mg by mouth 2 (two) times daily. Patient not taking: Reported on 07/30/2020    [provider]  omeprazole (PRILOSEC OTC) 20 MG tablet Take 20 mg by mouth daily before breakfast. Patient not taking: Reported on 07/30/2020    [provider]  pantoprazole (PROTONIX) 20 MG tablet Take 20 mg by mouth daily.    [provider]  phenazopyridine (PYRIDIUM) 95 MG tablet Take 95 mg by mouth 3 (three) times daily as needed for pain.    [provider]  POLYETHYLENE GLYCOL 3350 PO Take 17 g  by mouth daily as needed.    [provider]  psyllium (METAMUCIL) 58.6 % powder Take 1 packet by mouth 3 (three) times daily.    [provider]  simvastatin (ZOCOR) 40 MG tablet Take 40 mg by mouth at bedtime. Patient not taking: Reported on 07/30/2020 02/20/19   [provider]  timolol (TIMOPTIC) 0.5 % ophthalmic solution Place 1 drop into the right eye daily.    [provider]      Allergies    Acetazolamide er and Sertraline hcl    Review of Systems   Review of Systems  Physical Exam Updated Vital Signs There were no vitals taken for this visit. Physical Exam Vitals  and nursing note reviewed.  Constitutional:      General: He is not in acute distress.    Appearance: He is well-developed.  HENT:     Head: Normocephalic and atraumatic.     Mouth/Throat:     Mouth: Mucous membranes are moist.  Eyes:     Conjunctiva/sclera: Conjunctivae normal.     Pupils: Pupils are equal, round, and reactive to light.  Cardiovascular:     Rate and Rhythm: Normal rate and regular rhythm.     Pulses: Normal pulses.     Heart sounds: No murmur heard. Pulmonary:     Effort: Pulmonary effort is normal. No respiratory distress.     Breath sounds: Normal breath sounds. No wheezing or rales.  Abdominal:     General: There is no distension.     Palpations: Abdomen is soft.     Tenderness: There is no abdominal tenderness. There is no guarding or rebound.  Musculoskeletal:        General: No tenderness. Normal range of motion.     Cervical back: Normal range of motion and neck supple.  Skin:    General: Skin is warm and dry.     Findings: No erythema or rash.  Neurological:     Mental Status: He is alert.     Comments: Patient is oriented to person.  Psychiatric:     Comments: Calm and cooperative    ED Results / Procedures / Treatments   Labs (all labs ordered are listed, but only abnormal results are displayed) Labs Reviewed  DIFFERENTIAL - Abnormal; Notable for the following components:      Result Value   Monocytes Absolute 1.1 (*)    All other components within normal limits  I-STAT CHEM 8, ED - Abnormal; Notable for the following components:   BUN 24 (*)    Glucose, Bld 139 (*)    All other components within normal limits  CBG MONITORING, ED - Abnormal; Notable for the following components:   Glucose-Capillary 140 (*)    All other components within normal limits  RESP PANEL BY RT-PCR (FLU A&B, COVID) ARPGX2  CBC  ETHANOL  PROTIME-INR  APTT  COMPREHENSIVE METABOLIC PANEL  RAPID URINE DRUG SCREEN, HOSP PERFORMED  URINALYSIS, ROUTINE W REFLEX  MICROSCOPIC  VALPROIC ACID LEVEL    EKG EKG Interpretation  Date/Time:  Tuesday March 30 2021 19:46:14 EST Ventricular Rate:  118 PR Interval:  161 QRS Duration: 133 QT Interval:  383 QTC Calculation: 537 R Axis:   -56 Text Interpretation: Sinus or ectopic atrial tachycardia RBBB and LAFB No previous tracing Confirmed by Blanchie Dessert [phone]) on 03/30/2021 8:00:21 PM  Radiology CT HEAD CODE STROKE WO CONTRAST  Result Date: 03/30/2021 CLINICAL DATA:  Code stroke. EXAM: CT HEAD WITHOUT CONTRAST TECHNIQUE: Contiguous  axial images were obtained from the base of the skull through the vertex without intravenous contrast. COMPARISON:  03/09/2019. FINDINGS: Brain: No evidence of acute infarction, hemorrhage, cerebral edema, mass, mass effect, or midline shift. No hydrocephalus. No extra-axial fluid collection. Periventricular white matter changes, likely the sequela of chronic small vessel ischemic disease. Vascular: No hyperdense vessel or unexpected calcification. Skull: Normal. Negative for fracture or focal lesion. Sinuses/Orbits: No acute finding. Other: The mastoid air cells are well aerated. ASPECTS [facility] Stroke [facility] Early CT Score) - Ganglionic level infarction (caudate, lentiform nuclei, internal capsule, insula, M1-M3 cortex): 7 - Supraganglionic infarction (M4-M6 cortex): 3 Total score (0-10 with 10 being normal): 10 IMPRESSION: 1. No acute intracranial process. 2. ASPECTS is 10 Code stroke imaging results were communicated on 03/30/2021 at 7:42 pm to provider Dr. Lorrin Goodell via secure text paging. Electronically Signed   By: Merilyn Baba M.D.   On: 03/30/2021 19:42    Procedures Procedures    Medications Ordered in ED Medications - No data to display  ED Course/ Medical Decision Making/ A&P                           Medical Decision Making  Patient is an elderly male with multiple comorbidities and medical problems who is currently on hospice care who is bedbound and  lives at home who is brought in today as concern for code stroke due to right-sided facial droop and slurred speech that started abruptly at 1800 although wife did notice it earlier today but had completely resolved.  Nobody else is present currently to give history to the patient other than the paramedics.  Neurology was at bedside when patient arrived and they have spoken with the patient's daughter who is currently on her way.  Patient appears slightly agitated, tachycardic but does not appear to be febrile.  Some of the agitation could be related to his dementia however concern for possible stroke, intracranial hemorrhage, sepsis, dehydration, AKI, electrolyte abnormality.  We will wait for daughter to arrive for further history.  External medical records were evaluated.  Patient given IV fluids.  Labs are pending.  I independently reviewed and interpreted head CT which does not show any evidence of acute bleed.  Radiology reported no acute findings.  EKG based on my interpretation today shows sinus Tachycardia without other acute changes.  I evaluated and interpreted the labs and patient CBC without acute findings today, CMP with stable renal function and no acute findings.  Dr. Tilman Neat with neurology who recommended an MRI.  Patient was given 0.5 mg of Ativan due to agitation and he will need to be able to be still during MRI.  We will wait until patient's daughter arrives to discuss if he would want that.  9:05 PM Patient's daughter and wife are now at bedside and report that this week patient has had many restless nights has not been sleeping during the night or the day and has had decreased oral intake some but is still eating and drinking.  He hallucinates at night and fidgets all the time.  He was taking phenobarbital every 12 hours and had started on Seroquel every 6 hours and wife reported that that seemed to work but then she ran out of the phenobarbital and he has been now without it for  approximately a week.  She had talked with hospice who told her to discontinue the Seroquel but she reports it is not working.  He  has a cough all the time and she reports he does seem to choke on his food at times.  He is still receiving hospice services but they did not feel that he was at the point where he needed to be placed in hospice house.  Rectal exam shows that patient does have a temperature of 100.5.  We will check a urine and chest x-ray to ensure there is no evidence of infection his family reports he would want to be treated with antibiotics but at this time they are refusing MRI as they feel that he would not want this done and low suspicion that he will be able to hold still an MRI.  They do wish to take the patient home.  We will give IV fluids and reevaluate.    10:58 PM I have independently evaluated the patient's chest x-ray and interpreted as left lower patchy infiltrate.  Patient has not been able to provide a urine thus far however he will need treatment for pneumonia today and will be given Augmentin and doxycycline.  This should cover a UTI but he will be discharged home with a collection specimen that hospice can send for a culture as needed.  He is not requiring oxygen at this time.  Family desires for him to go home.  Ensure that his tablets can be crushed with pharmacy.         Final Clinical Impression(s) / ED Diagnoses Final diagnoses:  Community acquired pneumonia of left lower lobe of lung    Rx / DC Orders ED Discharge Orders          Ordered    amoxicillin-clavulanate (AUGMENTIN) 875-125 MG tablet  Every 12 hours        03/30/21 2303    doxycycline (VIBRAMYCIN) 100 MG capsule  2 times daily        03/30/21 2303              Blanchie Dessert, MD 03/30/21 2303

## 2021-03-30 NOTE — Consult Note (Signed)
NEUROLOGY CONSULTATION NOTE   Date of service: March 30, 2021 Patient Name: Mathew Ochoa MRN:  [id number] DOB:  1932-05-02 Reason for consult: "Stroke code for left facial droop and dysarthric speech" Requesting Provider: Blanchie Dessert, MD _ _ _   _ __   _ __ _ _  __ __   _ __   __ _  History of Present Illness  Mathew Ochoa is a 88 y.o. male with PMH significant for GERD, Glaucoma, OSA, alzheimer's dementia, parkinson's dementia who presents with a left facial droop and slurred speech.  Symptoms started around 1800. He earlier had some slurring of speech which resolved.  He has been bed ridden since thanksgiving. He has ben having trouble sleeping and has not slept much in the last 24 hours.  mRS: 4 tNKASE: not offered, symptoms are too mild to treat. Thrombectomy: symptoms too mild to treat. NIHSS components Score: Comment  1a Level of Conscious 0[x]  1[]  2[]  3[]      1b LOC Questions 0[]  1[]  2[x]       1c LOC Commands 0[x]  1[]  2[]       2 Best Gaze 0[x]  1[]  2[]       3 Visual 0[x]  1[]  2[]  3[]      4 Facial Palsy 0[x]  1[]  2[]  3[]      5a Motor Arm - left 0[x]  1[]  2[]  3[]  4[]  UN[]    5b Motor Arm - Right 0[x]  1[]  2[]  3[]  4[]  UN[]    6a Motor Leg - Left 0[]  1[]  2[]  3[x]  4[]  UN[]    6b Motor Leg - Right 0[]  1[]  2[]  3[x]  4[]  UN[]    7 Limb Ataxia 0[x]  1[]  2[]  3[]  UN[]     8 Sensory 0[x]  1[]  2[]  UN[]      9 Best Language 0[x]  1[]  2[]  3[]      10 Dysarthria 0[x]  1[]  2[]  UN[]      11 Extinct. and Inattention 0[x]  1[]  2[]       TOTAL: 8       ROS   Denies any pain but unable to provide reliable ROS due to dementia and encephalopathy.  Past History   Past Medical History:  Diagnosis Date   Adenomatous colon polyp    Arthritis    Concussion    Constipation    Demyelinating disorder ([place])    on ncv study   Diplopia    Facial palsy    GERD (gastroesophageal reflux disease)    Glaucoma    Impaired fasting glucose    Lower back pain    OSA (obstructive sleep apnea)     Osteoarthritis of both hips    Peripheral neuropathy    Skin lesions    Past Surgical History:  Procedure Laterality Date   ANKLE SURGERY     APPENDECTOMY     88 yrs old   [place] 2021   COLONOSCOPY WITH PROPOFOL  02/14/2012   Procedure: COLONOSCOPY WITH PROPOFOL;  Surgeon: Garlan Fair, MD;  Location: [facility];  Service: Endoscopy;  Laterality: N/A;  katrina/leone   inguinal heniorrhaphy     IRRIGATION AND DEBRIDEMENT SEBACEOUS CYST     right leg surgery for fracture  yrs ago   pin later removed   Family History  Problem Relation Age of Onset   Heart failure Mother    Diabetes Father    Heart disease Father    Heart disease Brother    Heart disease Brother    Cancer - Other Daughter  Cancer - Colon Brother    Cancer - Prostate Brother    Social History   Socioeconomic History   Marital status: Married    Spouse name: Mathew Ochoa   Number of children: 3   Years of education: college   Highest education level: Not on file  Occupational History   Occupation: Geophysicist/field seismologist    Comment: Part Time @ [employer]  Tobacco Use   Smoking status: Never   Smokeless tobacco: Never  Vaping Use   Vaping Use: Never used  Substance and Sexual Activity   Alcohol use: No   Drug use: No   Sexual activity: Not on file  Other Topics Concern   Not on file  Social History Narrative   Pt lives at home with his spouse.   Caffeine Use- Rarely drinks 1 soda.   Right handed   Social Determinants of Health   Financial Resource Strain: Not on file  Food Insecurity: Not on file  Transportation Needs: Not on file  Physical Activity: Not on file  Stress: Not on file  Social Connections: Not on file   Allergies  Allergen Reactions   Acetazolamide Er     Bowel issues    Sertraline Hcl Diarrhea    Medications  (Not in a hospital admission)    Vitals   There were no vitals filed for this visit.   There is no height or weight  on file to calculate BMI.  Physical Exam   General: Laying comfortably in bed; in no acute distress. HENT: Normal oropharynx and mucosa. Normal external appearance of ears and nose.  Neck: Supple, no pain or tenderness  CV: No JVD. No peripheral edema.  Pulmonary: Symmetric Chest rise. Normal respiratory effort.  Abdomen: Soft to touch, non-tender.  Ext: No cyanosis, edema, or deformity  Skin: No rash. Normal palpation of skin.   Musculoskeletal: Normal digits and nails by inspection. No clubbing.   Neurologic Examination  Mental status/Cognition: Alert, oriented to self, but not to place, month and year. Poor attention. Speech/language: Fluent, comprehension intact, object naming intact, repetition intact. Cranial nerves:   CN II Pupils equal and reactive to light, no VF deficits   CN III,IV,VI EOM intact, no gaze preference or deviation, no nystagmus    CN V normal sensation in V1, V2, and V3 segments bilaterally   CN VII no asymmetry, no nasolabial fold flattening   CN VIII normal hearing to speech    CN IX & X normal palatal elevation, no uvular deviation   CN XI 5/5 head turn and 5/5 shoulder shrug bilaterally   CN XII midline tongue protrusion   Motor:  Muscle bulk: normal, tone increased with cogwheeling Mvmt Root Nerve  Muscle Right Left Comments  SA C5/6 Ax Deltoid     EF C5/6 Mc Biceps 5 5   EE C6/7/8 Rad Triceps 5 5   WF C6/7 Med FCR     WE C7/8 PIN ECU     F Ab C8/T1 U ADM/FDI 5 5   HF L1/2/3 Fem Illopsoas 4 4   KE L2/3/4 Fem Quad     DF L4/5 D Peron Tib Ant 4 4   PF S1/2 Tibial Grc/Sol 4 4    Reflexes:  Right Left Comments  Pectoralis      Biceps (C5/6) 2 2   Brachioradialis (C5/6) 2 2    Triceps (C6/7) 2 2    Patellar (L3/4) 2 2    Achilles (S1)  Hoffman      Plantar     Jaw jerk    Sensation:  Light touch Intact throughout   Pin prick    Temperature    Vibration   Proprioception    Coordination/Complex Motor:  - Finger to Nose intact  BL - Heel to shin unable to do - Rapid alternating movement are slowed. - Gait: Deferred for patient safety, he is bedbound pretty much at baseline.  Labs   CBC: No results for input(s): WBC, NEUTROABS, HGB, HCT, MCV, PLT in the last 168 hours.  Basic Metabolic Panel:  Lab Results  Component Value Date   NA 144 03/11/2019   K 4.2 03/11/2019   CO2 23 03/11/2019   GLUCOSE 107 (H) 03/11/2019   BUN 30 (H) 03/11/2019   CREATININE 1.23 03/11/2019   CALCIUM 9.7 03/11/2019   GFRNONAA 54 (L) 03/11/2019   GFRAA >60 03/11/2019   Lipid Panel: No results found for: LDLCALC HgbA1c: No results found for: HGBA1C Urine Drug Screen: No results found for: LABOPIA, COCAINSCRNUR, LABBENZ, AMPHETMU, THCU, LABBARB  Alcohol Level No results found for: ETH  CT Head without contrast(Personally reviewed): CTH was negative for a large hypodensity concerning for a large territory infarct or hyperdensity concerning for an ICH  MRI Brain: pending   Impression   Mathew Ochoa is a 88 y.o. male with PMH significant for GERD, Glaucoma, OSA, alzheimer's dementia, parkinson's dementia who presents with a left facial droop and slurred speech.  Initially did have concerns for a left facial droop and slurred speech but he was also leaning on his left and the noted droop could just have been a facial fold. On subsequent evaluation, did not notice a facial droop or slurring. His exam is challenging and seems to wax and wane. He will follow commands at times but at other time, refuse to do much which makes it difficult to clinical evaluate for a potential stroke/TIA.  I do have some concern for a TIA/minor ischemic stroke so would recommend getting an MRI Brain but his daughter reports that he hasn't slept in over 24 hours and that could also be contributing significantly to his presentation. He is also high risk for delirium in new surroundings per daughter.  Primary Diagnosis:  Rule out  stroke.   Recommendations  - MRI Brain without contrast. No further workup if negative. ______________________________________________________________   Thank you for the opportunity to take part in the care of this patient. If you have any further questions, please contact the neurology consultation attending.  Signed,  [place] Pager Number [phone] _ _ _   _ __   _ __ _ _  __ __   _ __   __ _

## 2021-03-31 DIAGNOSIS — I10 Essential (primary) hypertension: Secondary | ICD-10-CM | POA: Diagnosis not present

## 2021-03-31 DIAGNOSIS — Z743 Need for continuous supervision: Secondary | ICD-10-CM | POA: Diagnosis not present

## 2021-03-31 LAB — URINALYSIS, ROUTINE W REFLEX MICROSCOPIC
Bilirubin Urine: NEGATIVE
Glucose, UA: NEGATIVE mg/dL
Hgb urine dipstick: NEGATIVE
Ketones, ur: NEGATIVE mg/dL
Nitrite: NEGATIVE
Protein, ur: NEGATIVE mg/dL
Specific Gravity, Urine: 1.025 (ref 1.005–1.030)
pH: 6 (ref 5.0–8.0)

## 2021-03-31 LAB — URINALYSIS, MICROSCOPIC (REFLEX): Bacteria, UA: NONE SEEN

## 2021-03-31 LAB — RAPID URINE DRUG SCREEN, HOSP PERFORMED
Amphetamines: NOT DETECTED
Barbiturates: POSITIVE — AB
Benzodiazepines: NOT DETECTED
Cocaine: NOT DETECTED
Opiates: NOT DETECTED
Tetrahydrocannabinol: NOT DETECTED

## 2021-03-31 MED ORDER — ACETAMINOPHEN 160 MG/5ML PO SOLN
ORAL | Status: AC
  Filled 2021-03-31: qty 20.3

## 2021-03-31 MED ORDER — ACETAMINOPHEN 160 MG/5ML PO SOLN: 655.0000 mg | Freq: Once | ORAL | Status: DC

## 2021-06-26 DEATH — deceased

## 2022-04-19 IMAGING — DX DG CHEST 1V PORT
1 series · 1 of 1 positions shown · non-contrast
Comparison: None.

CLINICAL DATA: Cough and fever.

EXAM:
PORTABLE CHEST 1 VIEW

[chest ap]
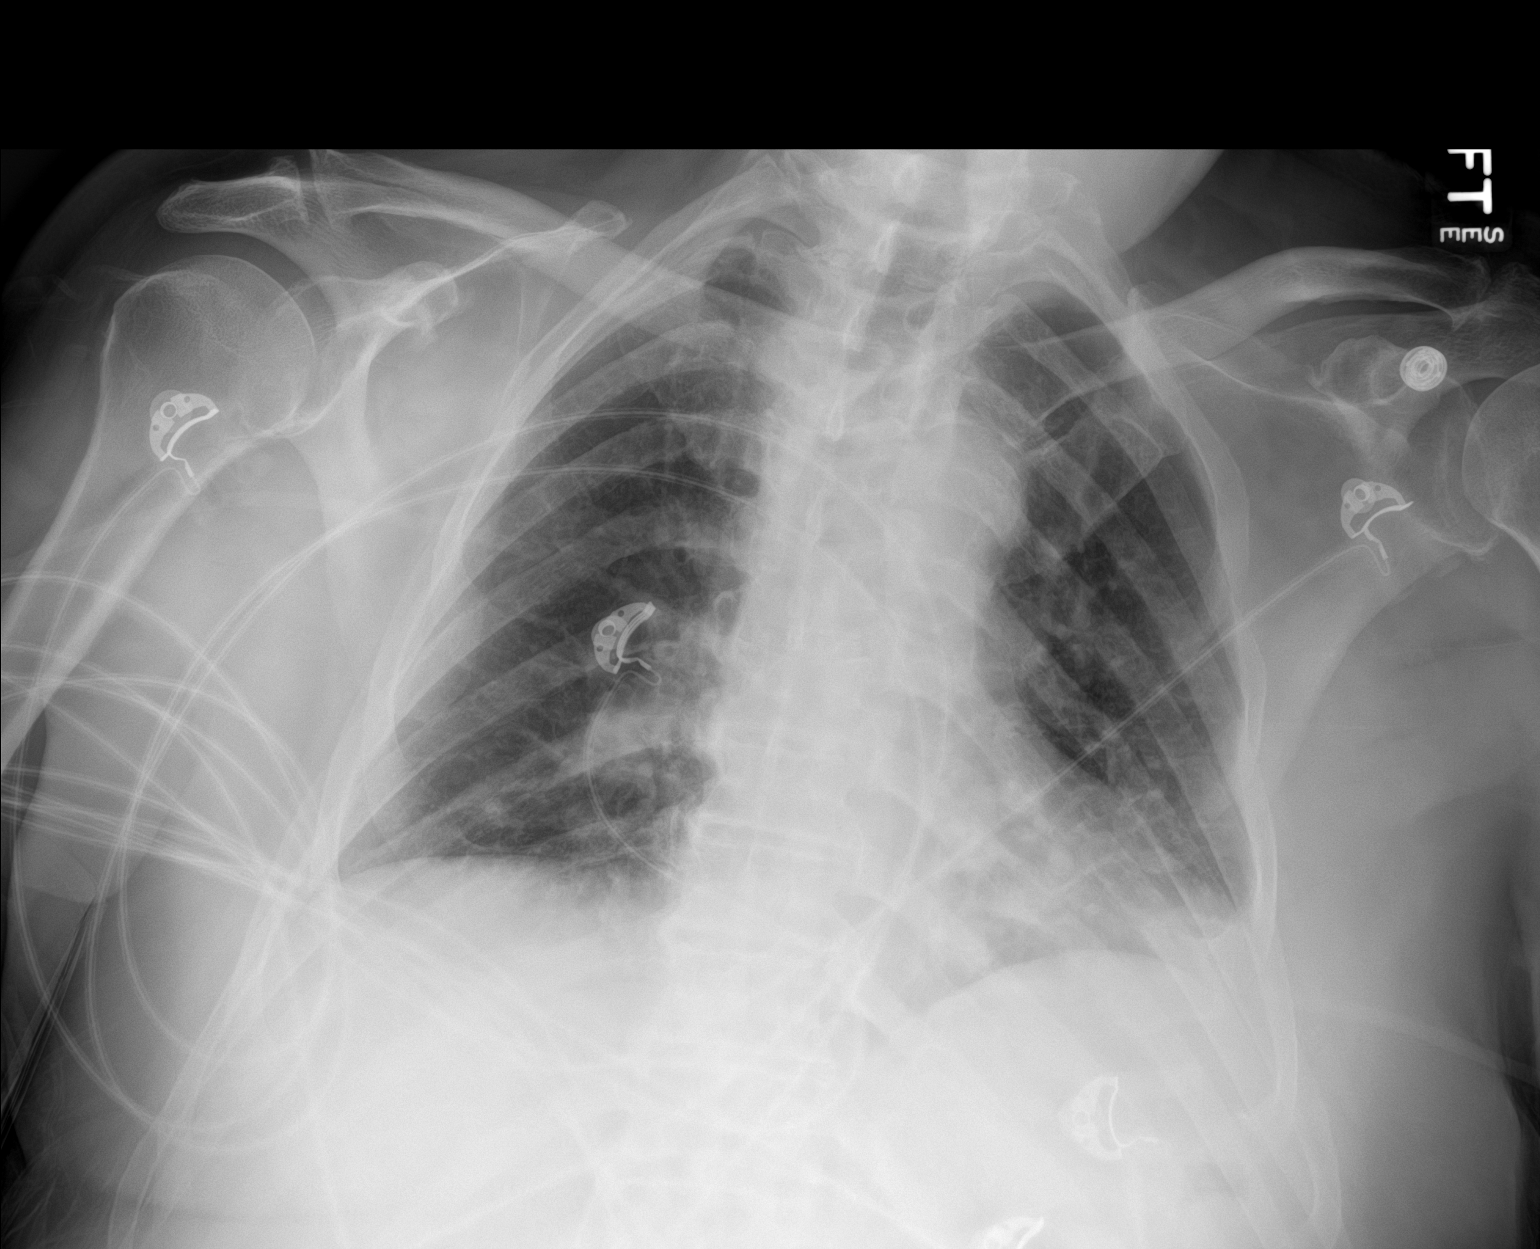

[1 of 1 positions shown; findings below may reference images not displayed]

FINDINGS: The heart size and mediastinal contours are within normal limits.
Mild patchy airspace disease is present at the lung bases, greater
on the left than on the right. There is blunting of the left
costophrenic angle and a small left pleural effusion can not be
excluded. No pneumothorax. No acute osseous abnormality.
IMPRESSION: 1. Patchy airspace disease at the lung bases, greater on the left
than on the right, possible atelectasis or infiltrate.
2. Mild blunting of the left costophrenic angle, possible small
pleural effusion.
